# Patient Record
Sex: Female | Born: 1935 | Race: White | Hispanic: No | Smoking: Never smoker
Health system: Southern US, Community
[De-identification: ages and names within clinical notes are randomized; demographics above are authoritative.]

## PROBLEM LIST (undated history)

## (undated) DIAGNOSIS — R569 Unspecified convulsions: Secondary | ICD-10-CM

## (undated) DIAGNOSIS — I639 Cerebral infarction, unspecified: Secondary | ICD-10-CM

## (undated) DIAGNOSIS — I10 Essential (primary) hypertension: Secondary | ICD-10-CM

## (undated) DIAGNOSIS — F0390 Unspecified dementia without behavioral disturbance: Secondary | ICD-10-CM

## (undated) DIAGNOSIS — F419 Anxiety disorder, unspecified: Secondary | ICD-10-CM

## (undated) DIAGNOSIS — K219 Gastro-esophageal reflux disease without esophagitis: Secondary | ICD-10-CM

## (undated) HISTORY — PX: APPENDECTOMY: SHX54

## (undated) HISTORY — PX: FRACTURE SURGERY: SHX138

## (undated) HISTORY — PX: BREAST CYST EXCISION: SHX579

## (undated) HISTORY — PX: TONSILLECTOMY: SUR1361

---

## 2016-11-10 DIAGNOSIS — I639 Cerebral infarction, unspecified: Secondary | ICD-10-CM

## 2016-11-10 HISTORY — DX: Cerebral infarction, unspecified: I63.9

## 2017-05-21 ENCOUNTER — Encounter (HOSPITAL_COMMUNITY): Payer: Self-pay | Admitting: Nurse Practitioner

## 2017-05-21 ENCOUNTER — Emergency Department (HOSPITAL_COMMUNITY): Payer: Medicare HMO

## 2017-05-21 ENCOUNTER — Inpatient Hospital Stay (HOSPITAL_COMMUNITY)
Admission: EM | Admit: 2017-05-21 | Discharge: 2017-05-25 | DRG: 481 | Disposition: A | Discharge status: Home / Self Care | Payer: Medicare HMO | Attending: Family Medicine | Admitting: Family Medicine

## 2017-05-21 DIAGNOSIS — Z79899 Other long term (current) drug therapy: Secondary | ICD-10-CM | POA: Diagnosis not present

## 2017-05-21 DIAGNOSIS — I10 Essential (primary) hypertension: Secondary | ICD-10-CM | POA: Diagnosis present

## 2017-05-21 DIAGNOSIS — D649 Anemia, unspecified: Secondary | ICD-10-CM | POA: Diagnosis present

## 2017-05-21 DIAGNOSIS — D62 Acute posthemorrhagic anemia: Secondary | ICD-10-CM | POA: Diagnosis not present

## 2017-05-21 DIAGNOSIS — S72141A Displaced intertrochanteric fracture of right femur, initial encounter for closed fracture: Principal | ICD-10-CM | POA: Diagnosis present

## 2017-05-21 DIAGNOSIS — Z8673 Personal history of transient ischemic attack (TIA), and cerebral infarction without residual deficits: Secondary | ICD-10-CM | POA: Diagnosis not present

## 2017-05-21 DIAGNOSIS — F0280 Dementia in other diseases classified elsewhere without behavioral disturbance: Secondary | ICD-10-CM | POA: Diagnosis present

## 2017-05-21 DIAGNOSIS — G301 Alzheimer's disease with late onset: Secondary | ICD-10-CM

## 2017-05-21 DIAGNOSIS — R569 Unspecified convulsions: Secondary | ICD-10-CM | POA: Diagnosis present

## 2017-05-21 DIAGNOSIS — W010XXA Fall on same level from slipping, tripping and stumbling without subsequent striking against object, initial encounter: Secondary | ICD-10-CM | POA: Diagnosis present

## 2017-05-21 DIAGNOSIS — E876 Hypokalemia: Secondary | ICD-10-CM | POA: Diagnosis present

## 2017-05-21 DIAGNOSIS — S72001A Fracture of unspecified part of neck of right femur, initial encounter for closed fracture: Secondary | ICD-10-CM

## 2017-05-21 DIAGNOSIS — G309 Alzheimer's disease, unspecified: Secondary | ICD-10-CM | POA: Diagnosis present

## 2017-05-21 DIAGNOSIS — Z419 Encounter for procedure for purposes other than remedying health state, unspecified: Secondary | ICD-10-CM

## 2017-05-21 DIAGNOSIS — D696 Thrombocytopenia, unspecified: Secondary | ICD-10-CM | POA: Diagnosis present

## 2017-05-21 HISTORY — DX: Essential (primary) hypertension: I10

## 2017-05-21 HISTORY — DX: Unspecified dementia, unspecified severity, without behavioral disturbance, psychotic disturbance, mood disturbance, and anxiety: F03.90

## 2017-05-21 HISTORY — DX: Cerebral infarction, unspecified: I63.9

## 2017-05-21 HISTORY — DX: Anxiety disorder, unspecified: F41.9

## 2017-05-21 HISTORY — DX: Unspecified convulsions: R56.9

## 2017-05-21 HISTORY — DX: Gastro-esophageal reflux disease without esophagitis: K21.9

## 2017-05-21 LAB — CBC WITH DIFFERENTIAL/PLATELET
Basophils Absolute: 0 10*3/uL (ref 0.0–0.1)
Basophils Relative: 0 %
EOS PCT: 1 %
Eosinophils Absolute: 0.1 10*3/uL (ref 0.0–0.7)
HCT: 32.8 % — ABNORMAL LOW (ref 36.0–46.0)
Hemoglobin: 11.3 g/dL — ABNORMAL LOW (ref 12.0–15.0)
LYMPHS ABS: 1.1 10*3/uL (ref 0.7–4.0)
LYMPHS PCT: 13 %
MCH: 31.3 pg (ref 26.0–34.0)
MCHC: 34.5 g/dL (ref 30.0–36.0)
MCV: 90.9 fL (ref 78.0–100.0)
MONO ABS: 0.6 10*3/uL (ref 0.1–1.0)
MONOS PCT: 7 %
Neutro Abs: 6.6 10*3/uL (ref 1.7–7.7)
Neutrophils Relative %: 79 %
PLATELETS: 161 10*3/uL (ref 150–400)
RBC: 3.61 MIL/uL — AB (ref 3.87–5.11)
RDW: 14.4 % (ref 11.5–15.5)
WBC: 8.4 10*3/uL (ref 4.0–10.5)

## 2017-05-21 LAB — BASIC METABOLIC PANEL
Anion gap: 10 (ref 5–15)
BUN: 20 mg/dL (ref 6–20)
CHLORIDE: 102 mmol/L (ref 101–111)
CO2: 25 mmol/L (ref 22–32)
Calcium: 9.1 mg/dL (ref 8.9–10.3)
Creatinine, Ser: 0.85 mg/dL (ref 0.44–1.00)
GFR calc Af Amer: >60 mL/min (ref >60)
GFR calc non Af Amer: >60 mL/min (ref >60)
GLUCOSE: 122 mg/dL — AB (ref 65–99)
POTASSIUM: 3.2 mmol/L — AB (ref 3.5–5.1)
Sodium: 137 mmol/L (ref 135–145)

## 2017-05-21 MED ORDER — FENTANYL CITRATE (PF) 100 MCG/2ML IJ SOLN: 12.5000 ug | Freq: Once | INTRAMUSCULAR | Status: DC

## 2017-05-21 MED ORDER — FENTANYL CITRATE (PF) 100 MCG/2ML IJ SOLN
12.5000 ug | Freq: Once | INTRAMUSCULAR | Status: AC | PRN
  Administered 2017-05-22: 12.5 ug via INTRAMUSCULAR
  Filled 2017-05-21: qty 2

## 2017-05-21 MED ORDER — LABETALOL HCL 5 MG/ML IV SOLN
20.0000 mg | Freq: Once | INTRAVENOUS | Status: AC
  Administered 2017-05-21: 20 mg via INTRAVENOUS
  Filled 2017-05-21: qty 4

## 2017-05-21 NOTE — ED Provider Notes (Signed)
[facility]-EMERGENCY DEPT Provider Note   CSN: [id number] Arrival date & time: 05/21/17  1951     History   Chief Complaint Chief Complaint  Patient presents with  . Fall    HPI Lynn Reynolds is a 83 y.o. female with past medical history of dementia, hypertension,CVA, presenting with acute onset of right hip pain status post mechanical fall. And states she was playing a game when she jumped up and tripped, falling backwards hitting her head on the couch. Denies LOC. States pain in her head lasted a short period of time, however her right hip continues to give her pain well as her right knee. States hip pain is made worse with movement. She denies numbness or tingling in her extremities, headache, vision changes, nausea, neck or back pain, wounds, or any other symptoms. Her family states she is at her mental baseline. She is not on anticoagulation.   The history is provided by the patient and a relative.    Past Medical History:  Diagnosis Date  . CVA (cerebral vascular accident) (HCC)   . Dementia     Patient Active Problem List   Diagnosis Date Noted  . Closed intertrochanteric fracture of hip, right, initial encounter (HCC) 05/21/2017    Past Surgical History:  Procedure Laterality Date  . APPENDECTOMY      OB History    No data available       Home Medications    Prior to Admission medications   Medication Sig Start Date End Date Taking? Authorizing Provider  acetaminophen (TYLENOL) 500 MG tablet Take 500 mg by mouth every 4 (four) hours as needed for mild pain or moderate pain.   Yes [provider]  alum & mag hydroxide-simeth (MAALOX PLUS) 400-400-40 MG/5ML suspension Take 15 mLs by mouth every 6 (six) hours as needed for indigestion.   Yes [provider]  busPIRone (BUSPAR) 15 MG tablet Take 7.5 mg by mouth 2 (two) times daily.   Yes [provider]  carvedilol (COREG) 12.5 MG tablet Take 12.5 mg by mouth 2 (two) times daily with a  meal.   Yes [provider]  cholecalciferol (VITAMIN D) 1000 units tablet Take 2,000 Units by mouth daily.   Yes [provider]  estradiol (ESTRACE) 0.1 MG/GM vaginal cream Place 1 Applicatorful vaginally 2 (two) times a week.   Yes [provider]  ferrous sulfate 325 (65 FE) MG EC tablet Take 325 mg by mouth 2 (two) times daily.   Yes [provider]  hydrochlorothiazide (MICROZIDE) 12.5 MG capsule Take 12.5 mg by mouth daily.   Yes [provider]  levETIRAcetam (KEPPRA) 500 MG tablet Take 500 mg by mouth 2 (two) times daily.   Yes [provider]  Melatonin 3 MG TABS Take 3 mg by mouth at bedtime.   Yes [provider]  OVER THE COUNTER MEDICATION Place 1 drop into both ears daily.   Yes [provider]  phenazopyridine (PYRIDIUM) 100 MG tablet Take 200 mg by mouth 3 (three) times daily as needed for pain.   Yes [provider]  senna-docusate (SENOKOT-S) 8.6-50 MG tablet Take 1 tablet by mouth daily.   Yes [provider]  sertraline (ZOLOFT) 50 MG tablet Take 50 mg by mouth daily.   Yes [provider]  vitamin B-12 (CYANOCOBALAMIN) 500 MCG tablet Take 1,500 mcg by mouth daily.   Yes [provider]    Family History History reviewed. No pertinent family history.  Social  History Social History  Substance Use Topics  . Smoking status: Never Smoker  . Smokeless tobacco: Never Used  . Alcohol use Not on file     Allergies   Latex   Review of Systems Review of Systems  Constitutional: Negative.   HENT: Negative.   Eyes: Negative for visual disturbance.  Respiratory: Negative for shortness of breath.   Cardiovascular: Negative for chest pain.  Gastrointestinal: Negative for abdominal pain, nausea and vomiting.  Genitourinary: Negative.   Musculoskeletal: Positive for arthralgias and myalgias. Negative for back pain and neck pain.  Skin: Negative for wound.    Neurological: Negative for syncope, facial asymmetry, speech difficulty, numbness and headaches.  Hematological: Does not bruise/bleed easily.  Psychiatric/Behavioral: Negative for confusion.     Physical Exam Updated Vital Signs BP (!) 197/75 (BP Location: Right Arm)   Pulse 70   Temp 97.6 F (36.4 C) (Oral)   Resp 18   SpO2 100%   Physical Exam  Constitutional: She appears well-developed and well-nourished. No distress.  HENT:  Head: Normocephalic and atraumatic.  Mouth/Throat: Oropharynx is clear and moist.  No scalp hematoma or tenderness.  Eyes: Pupils are equal, round, and reactive to light. Conjunctivae and EOM are normal.  Neck: Normal range of motion. Neck supple.  Cardiovascular: Normal rate, regular rhythm and intact distal pulses.   Pulmonary/Chest: Effort normal and breath sounds normal. No respiratory distress. She has no wheezes. She has no rales.  Abdominal: Soft. Bowel sounds are normal. She exhibits no distension. There is no tenderness. There is no rebound and no guarding.  Musculoskeletal:  Right hip with tenderness in the groin, and pain with internal/external rotation as well as flexion and extension. No gross deformities. Right knee with mild generalized tenderness,normal range of motion. No deformities, edema or ecchymosis. Left lower extremity as well as bilateral upper extremities with normal range of motion, nontender, without deformities or edema. No C, T, or L-spine or paraspinal tenderness.  Neurological: She is alert.  No cranial nerve deficits. 5/5 strength bilateral upper extremities and left lower extremity. Difficult to assess strength in right leg secondary to pain. Normal finger to nose. Normal sensation all distal extremities. No facial asymmetry or slurred speech.  Skin: Skin is warm.  Psychiatric: She has a normal mood and affect. Her behavior is normal.  Nursing note and vitals reviewed.    ED Treatments / Results  Labs (all labs  ordered are listed, but only abnormal results are displayed) Labs Reviewed  BASIC METABOLIC PANEL - Abnormal; Notable for the following:       Result Value   Potassium 3.2 (*)    Glucose, Bld 122 (*)    All other components within normal limits  CBC WITH DIFFERENTIAL/PLATELET - Abnormal; Notable for the following:    RBC 3.61 (*)    Hemoglobin 11.3 (*)    HCT 32.8 (*)    All other components within normal limits    EKG  EKG Interpretation None       Radiology Dg Knee Complete 4 Views Right  Result Date: 05/21/2017 CLINICAL DATA:  Status post fall, with right knee pain. Initial encounter. EXAM: RIGHT KNEE - COMPLETE 4+ VIEW COMPARISON:  None. FINDINGS: There is no evidence of fracture or dislocation. The joint spaces are preserved. No significant degenerative change is seen; the patellofemoral joint is grossly unremarkable in appearance. Chondrocalcinosis is noted. No significant joint effusion is seen. The visualized soft tissues are normal in appearance. IMPRESSION: 1. No evidence of  fracture or dislocation. 2. Chondrocalcinosis noted. Electronically Signed   By: Roanna Raider M.D.   On: 05/21/2017 22:26   Dg Hip Unilat With Pelvis 2-3 Views Right  Result Date: 05/21/2017 CLINICAL DATA:  Status post fall, with right hip pain. Initial encounter. EXAM: DG HIP (WITH OR WITHOUT PELVIS) 2-3V RIGHT COMPARISON:  None. FINDINGS: There appears to be a minimally displaced right femoral intertrochanteric fracture. Both femoral heads are seated normally within their respective acetabula. The proximal right femur appears intact. Mild degenerative change is noted at the lower lumbar spine. The sacroiliac joints are unremarkable in appearance. The visualized bowel gas pattern is grossly unremarkable in appearance. IMPRESSION: Minimally displaced right femoral intertrochanteric fracture suspected. Electronically Signed   By: Roanna Raider M.D.   On: 05/21/2017 22:26    Procedures Procedures  (including critical care time)  Medications Ordered in ED Medications  fentaNYL (SUBLIMAZE) injection 12.5 mcg (not administered)  labetalol (NORMODYNE,TRANDATE) injection 20 mg (not administered)     Initial Impression / Assessment and Plan / ED Course  I have reviewed the triage vital signs and the nursing notes.  Pertinent labs & imaging results that were available during my care of the patient were reviewed by me and considered in my medical decision making (see chart for details).     Patient presenting with right hip pain status post mechanical fall. X-ray of right hip showing minimally displaced intertrochanteric fracture. NV intact.  No spinal or paraspinal tenderness.  No focal neuro deficits, no scalp hematoma or tenderness. Patient is at her mental baseline per family members.  Remainder of exam unremarkable. Discussed results with family and patient. Consult made to orthopedics, spoke with Dr. Dion Saucier, who recommends patient be transferred to [facility] for medical admission. Patient to be NPO at midnight, in preparation for surgery tomorrow. Patient agreeable to plan for transfer, with anticipated discussion with orthopedics in the morning. Consult made to hospitalist for medical admission and transfer; spoke with Dr. Maryfrances Bunnell with [facility], who accepts pt to service for transfer to [facility]. Accepting physician at [facility] is Dr. Julian Reil.  Patient discussed with and seen by Dr. Adriana Simas.  The patient appears reasonably stabilized for admission considering the current resources, flow, and capabilities available in the ED at this time, and I doubt any other [facility] requiring further screening and/or treatment in the ED prior to admission.  Final Clinical Impressions(s) / ED Diagnoses   Final diagnoses:  Displaced intertrochanteric fracture of right femur, initial encounter for closed fracture [facility])    New Prescriptions New Prescriptions   No medications on file       Russo, [place] N, PA-C 05/21/17 2330    Donnetta Hutching, MD 05/23/17 1546

## 2017-05-21 NOTE — ED Triage Notes (Signed)
Pt is presented by family members who report that she had a fall, c/o severe bilateral ankle and knee pain.

## 2017-05-22 ENCOUNTER — Inpatient Hospital Stay (HOSPITAL_COMMUNITY): Payer: Medicare HMO

## 2017-05-22 ENCOUNTER — Encounter (HOSPITAL_COMMUNITY): Payer: Self-pay | Admitting: Family Medicine

## 2017-05-22 ENCOUNTER — Inpatient Hospital Stay (HOSPITAL_COMMUNITY): Payer: Medicare HMO | Admitting: Certified Registered Nurse Anesthetist

## 2017-05-22 ENCOUNTER — Encounter (HOSPITAL_COMMUNITY)
Admission: EM | Disposition: A | Discharge status: Home / Self Care | Payer: Self-pay | Source: Home / Self Care | Attending: Family Medicine

## 2017-05-22 DIAGNOSIS — D649 Anemia, unspecified: Secondary | ICD-10-CM | POA: Diagnosis present

## 2017-05-22 DIAGNOSIS — F0280 Dementia in other diseases classified elsewhere without behavioral disturbance: Secondary | ICD-10-CM | POA: Diagnosis present

## 2017-05-22 DIAGNOSIS — G301 Alzheimer's disease with late onset: Secondary | ICD-10-CM

## 2017-05-22 DIAGNOSIS — I10 Essential (primary) hypertension: Secondary | ICD-10-CM | POA: Diagnosis present

## 2017-05-22 DIAGNOSIS — R569 Unspecified convulsions: Secondary | ICD-10-CM

## 2017-05-22 DIAGNOSIS — E876 Hypokalemia: Secondary | ICD-10-CM | POA: Diagnosis present

## 2017-05-22 HISTORY — PX: FEMUR IM NAIL: SHX1597

## 2017-05-22 LAB — CBC
HCT: 31.5 % — ABNORMAL LOW (ref 36.0–46.0)
Hemoglobin: 10.7 g/dL — ABNORMAL LOW (ref 12.0–15.0)
MCH: 30.6 pg (ref 26.0–34.0)
MCHC: 34 g/dL (ref 30.0–36.0)
MCV: 90 fL (ref 78.0–100.0)
Platelets: 139 10*3/uL — ABNORMAL LOW (ref 150–400)
RBC: 3.5 MIL/uL — ABNORMAL LOW (ref 3.87–5.11)
RDW: 14.4 % (ref 11.5–15.5)
WBC: 6 10*3/uL (ref 4.0–10.5)

## 2017-05-22 LAB — BASIC METABOLIC PANEL
ANION GAP: 7 (ref 5–15)
BUN: 17 mg/dL (ref 6–20)
CALCIUM: 8.7 mg/dL — AB (ref 8.9–10.3)
CO2: 26 mmol/L (ref 22–32)
CREATININE: 0.8 mg/dL (ref 0.44–1.00)
Chloride: 102 mmol/L (ref 101–111)
Glucose, Bld: 120 mg/dL — ABNORMAL HIGH (ref 65–99)
Potassium: 3.2 mmol/L — ABNORMAL LOW (ref 3.5–5.1)
SODIUM: 135 mmol/L (ref 135–145)

## 2017-05-22 LAB — MAGNESIUM: MAGNESIUM: 1.8 mg/dL (ref 1.7–2.4)

## 2017-05-22 LAB — TROPONIN I

## 2017-05-22 LAB — SURGICAL PCR SCREEN
MRSA, PCR: POSITIVE — AB
Staphylococcus aureus: POSITIVE — AB

## 2017-05-22 SURGERY — INSERTION, INTRAMEDULLARY ROD, FEMUR
Anesthesia: General | Site: Hip | Laterality: Right

## 2017-05-22 MED ORDER — MELATONIN 3 MG PO TABS
3.0000 mg | ORAL_TABLET | Freq: Every day | ORAL | Status: DC
Start: 2017-05-22 — End: 2017-05-22

## 2017-05-22 MED ORDER — CEFAZOLIN SODIUM-DEXTROSE 2-4 GM/100ML-% IV SOLN
2.0000 g | INTRAVENOUS | Status: AC
  Administered 2017-05-22: 2 g via INTRAVENOUS
  Filled 2017-05-22: qty 100

## 2017-05-22 MED ORDER — BISACODYL 10 MG RE SUPP: 10.0000 mg | Freq: Every day | RECTAL | Status: DC | PRN

## 2017-05-22 MED ORDER — FENTANYL CITRATE (PF) 100 MCG/2ML IJ SOLN
INTRAMUSCULAR | Status: DC | PRN
  Administered 2017-05-22: 25 ug via INTRAVENOUS
  Administered 2017-05-22: 50 ug via INTRAVENOUS

## 2017-05-22 MED ORDER — MORPHINE SULFATE (PF) 4 MG/ML IV SOLN: 0.5000 mg | INTRAVENOUS | Status: DC | PRN

## 2017-05-22 MED ORDER — SUGAMMADEX SODIUM 200 MG/2ML IV SOLN
INTRAVENOUS | Status: DC | PRN
  Administered 2017-05-22: 100 mg via INTRAVENOUS

## 2017-05-22 MED ORDER — BUSPIRONE HCL 5 MG PO TABS
7.5000 mg | ORAL_TABLET | Freq: Two times a day (BID) | ORAL | Status: DC
  Filled 2017-05-22 (×2): qty 1.5

## 2017-05-22 MED ORDER — ACETAMINOPHEN 325 MG PO TABS
650.0000 mg | ORAL_TABLET | Freq: Four times a day (QID) | ORAL | Status: DC | PRN
  Administered 2017-05-22 – 2017-05-24 (×5): 650 mg via ORAL
  Filled 2017-05-22 (×5): qty 2

## 2017-05-22 MED ORDER — ALBUMIN HUMAN 5 % IV SOLN
INTRAVENOUS | Status: DC | PRN
  Administered 2017-05-22: 14:00:00 via INTRAVENOUS

## 2017-05-22 MED ORDER — LIDOCAINE HCL (CARDIAC) 20 MG/ML IV SOLN
INTRAVENOUS | Status: DC | PRN
  Administered 2017-05-22: 60 mg via INTRAVENOUS

## 2017-05-22 MED ORDER — ONDANSETRON HCL 4 MG/2ML IJ SOLN: 4.0000 mg | Freq: Four times a day (QID) | INTRAMUSCULAR | Status: DC | PRN

## 2017-05-22 MED ORDER — MORPHINE SULFATE (PF) 2 MG/ML IV SOLN
0.5000 mg | INTRAVENOUS | Status: DC | PRN
  Administered 2017-05-22: 0.5 mg via INTRAVENOUS
  Filled 2017-05-22: qty 1

## 2017-05-22 MED ORDER — PHENOL 1.4 % MT LIQD: 1.0000 | OROMUCOSAL | Status: DC | PRN

## 2017-05-22 MED ORDER — HYDROCODONE-ACETAMINOPHEN 5-325 MG PO TABS: 1.0000 | ORAL_TABLET | Freq: Four times a day (QID) | ORAL | Status: DC | PRN

## 2017-05-22 MED ORDER — ACETAMINOPHEN 650 MG RE SUPP: 650.0000 mg | Freq: Four times a day (QID) | RECTAL | Status: DC | PRN

## 2017-05-22 MED ORDER — ROCURONIUM BROMIDE 100 MG/10ML IV SOLN
INTRAVENOUS | Status: DC | PRN
  Administered 2017-05-22: 50 mg via INTRAVENOUS

## 2017-05-22 MED ORDER — ONDANSETRON HCL 4 MG/2ML IJ SOLN: 4.0000 mg | Freq: Once | INTRAMUSCULAR | Status: DC | PRN

## 2017-05-22 MED ORDER — HYDROCODONE-ACETAMINOPHEN 5-325 MG PO TABS: 1.0000 | ORAL_TABLET | Freq: Four times a day (QID) | ORAL | 0 refills | Status: DC | PRN

## 2017-05-22 MED ORDER — POTASSIUM CHLORIDE CRYS ER 20 MEQ PO TBCR
40.0000 meq | EXTENDED_RELEASE_TABLET | Freq: Two times a day (BID) | ORAL | Status: AC
  Administered 2017-05-22 (×2): 40 meq via ORAL
  Filled 2017-05-22 (×3): qty 2

## 2017-05-22 MED ORDER — CHLORHEXIDINE GLUCONATE 4 % EX LIQD: 60.0000 mL | Freq: Once | CUTANEOUS | Status: DC

## 2017-05-22 MED ORDER — ASPIRIN EC 325 MG PO TBEC
325.0000 mg | DELAYED_RELEASE_TABLET | Freq: Two times a day (BID) | ORAL | Status: DC
  Administered 2017-05-23 – 2017-05-25 (×5): 325 mg via ORAL
  Filled 2017-05-22 (×6): qty 1

## 2017-05-22 MED ORDER — POLYETHYLENE GLYCOL 3350 17 G PO PACK: 17.0000 g | PACK | Freq: Every day | ORAL | Status: DC | PRN

## 2017-05-22 MED ORDER — METOCLOPRAMIDE HCL 5 MG PO TABS: 5.0000 mg | ORAL_TABLET | Freq: Three times a day (TID) | ORAL | Status: DC | PRN

## 2017-05-22 MED ORDER — PROPOFOL 10 MG/ML IV BOLUS
INTRAVENOUS | Status: DC | PRN
Start: 2017-05-22 — End: 2017-05-22
  Administered 2017-05-22: 150 mg via INTRAVENOUS

## 2017-05-22 MED ORDER — SERTRALINE HCL 50 MG PO TABS
50.0000 mg | ORAL_TABLET | Freq: Every day | ORAL | Status: DC
  Administered 2017-05-22 – 2017-05-25 (×4): 50 mg via ORAL
  Filled 2017-05-22 (×4): qty 1

## 2017-05-22 MED ORDER — KETAMINE HCL-SODIUM CHLORIDE 100-0.9 MG/10ML-% IV SOSY
PREFILLED_SYRINGE | INTRAVENOUS | Status: AC
  Filled 2017-05-22: qty 10

## 2017-05-22 MED ORDER — FENTANYL CITRATE (PF) 100 MCG/2ML IJ SOLN: 25.0000 ug | INTRAMUSCULAR | Status: DC | PRN

## 2017-05-22 MED ORDER — CARVEDILOL 12.5 MG PO TABS
12.5000 mg | ORAL_TABLET | Freq: Two times a day (BID) | ORAL | Status: DC
  Administered 2017-05-22: 12.5 mg via ORAL
  Filled 2017-05-22 (×2): qty 1

## 2017-05-22 MED ORDER — MIDAZOLAM HCL 2 MG/2ML IJ SOLN
INTRAMUSCULAR | Status: AC
  Filled 2017-05-22: qty 2

## 2017-05-22 MED ORDER — SENNOSIDES-DOCUSATE SODIUM 8.6-50 MG PO TABS
1.0000 | ORAL_TABLET | Freq: Every day | ORAL | Status: DC
  Administered 2017-05-23 – 2017-05-25 (×3): 1 via ORAL
  Filled 2017-05-22 (×3): qty 1

## 2017-05-22 MED ORDER — FENTANYL CITRATE (PF) 250 MCG/5ML IJ SOLN
INTRAMUSCULAR | Status: AC
  Filled 2017-05-22: qty 5

## 2017-05-22 MED ORDER — ASPIRIN EC 325 MG PO TBEC: 325.0000 mg | DELAYED_RELEASE_TABLET | Freq: Two times a day (BID) | ORAL | 0 refills | Status: DC

## 2017-05-22 MED ORDER — 0.9 % SODIUM CHLORIDE (POUR BTL) OPTIME
TOPICAL | Status: DC | PRN
  Administered 2017-05-22: 1000 mL

## 2017-05-22 MED ORDER — MEPERIDINE HCL 25 MG/ML IJ SOLN: 6.2500 mg | INTRAMUSCULAR | Status: DC | PRN

## 2017-05-22 MED ORDER — LEVETIRACETAM 500 MG PO TABS
500.0000 mg | ORAL_TABLET | Freq: Two times a day (BID) | ORAL | Status: DC
  Administered 2017-05-22 – 2017-05-25 (×7): 500 mg via ORAL
  Filled 2017-05-22 (×8): qty 1

## 2017-05-22 MED ORDER — PHENYLEPHRINE 40 MCG/ML (10ML) SYRINGE FOR IV PUSH (FOR BLOOD PRESSURE SUPPORT)
PREFILLED_SYRINGE | INTRAVENOUS | Status: AC
  Filled 2017-05-22: qty 10

## 2017-05-22 MED ORDER — TIZANIDINE HCL 2 MG PO TABS: 2.0000 mg | ORAL_TABLET | Freq: Four times a day (QID) | ORAL | 0 refills | Status: DC | PRN

## 2017-05-22 MED ORDER — PROPOFOL 10 MG/ML IV BOLUS
INTRAVENOUS | Status: AC
  Filled 2017-05-22: qty 20

## 2017-05-22 MED ORDER — ONDANSETRON HCL 4 MG PO TABS: 4.0000 mg | ORAL_TABLET | Freq: Four times a day (QID) | ORAL | Status: DC | PRN

## 2017-05-22 MED ORDER — FERROUS SULFATE 325 (65 FE) MG PO TABS
325.0000 mg | ORAL_TABLET | Freq: Two times a day (BID) | ORAL | Status: DC
  Administered 2017-05-22 – 2017-05-25 (×6): 325 mg via ORAL
  Filled 2017-05-22 (×8): qty 1

## 2017-05-22 MED ORDER — HYDROCHLOROTHIAZIDE 12.5 MG PO CAPS
12.5000 mg | ORAL_CAPSULE | Freq: Every day | ORAL | Status: DC
  Administered 2017-05-23 – 2017-05-25 (×3): 12.5 mg via ORAL
  Filled 2017-05-22 (×3): qty 1

## 2017-05-22 MED ORDER — ONDANSETRON HCL 4 MG/2ML IJ SOLN
INTRAMUSCULAR | Status: DC | PRN
  Administered 2017-05-22: 4 mg via INTRAVENOUS

## 2017-05-22 MED ORDER — POVIDONE-IODINE 10 % EX SWAB: 2.0000 "application " | Freq: Once | CUTANEOUS | Status: DC

## 2017-05-22 MED ORDER — MENTHOL 3 MG MT LOZG: 1.0000 | LOZENGE | OROMUCOSAL | Status: DC | PRN

## 2017-05-22 MED ORDER — KCL IN DEXTROSE-NACL 20-5-0.45 MEQ/L-%-% IV SOLN
INTRAVENOUS | Status: DC
  Administered 2017-05-22 – 2017-05-23 (×2): via INTRAVENOUS
  Filled 2017-05-22 (×2): qty 1000

## 2017-05-22 MED ORDER — DOCUSATE SODIUM 100 MG PO CAPS
100.0000 mg | ORAL_CAPSULE | Freq: Two times a day (BID) | ORAL | Status: DC
  Administered 2017-05-22 – 2017-05-25 (×5): 100 mg via ORAL
  Filled 2017-05-22 (×5): qty 1

## 2017-05-22 MED ORDER — LACTATED RINGERS IV SOLN
INTRAVENOUS | Status: DC
  Administered 2017-05-22 (×2): via INTRAVENOUS

## 2017-05-22 MED ORDER — METOCLOPRAMIDE HCL 5 MG/ML IJ SOLN: 5.0000 mg | Freq: Three times a day (TID) | INTRAMUSCULAR | Status: DC | PRN

## 2017-05-22 MED ORDER — PHENYLEPHRINE 40 MCG/ML (10ML) SYRINGE FOR IV PUSH (FOR BLOOD PRESSURE SUPPORT)
PREFILLED_SYRINGE | INTRAVENOUS | Status: DC | PRN
  Administered 2017-05-22: 80 ug via INTRAVENOUS

## 2017-05-22 SURGICAL SUPPLY — 52 items
BENZOIN TINCTURE PRP APPL 2/3 (GAUZE/BANDAGES/DRESSINGS) IMPLANT
BOOTCOVER CLEANROOM LRG (PROTECTIVE WEAR) IMPLANT
CLOSURE STERI-STRIP 1/2X4 (GAUZE/BANDAGES/DRESSINGS)
CLSR STERI-STRIP ANTIMIC 1/2X4 (GAUZE/BANDAGES/DRESSINGS) IMPLANT
COVER PERINEAL POST (MISCELLANEOUS) IMPLANT
COVER SURGICAL LIGHT HANDLE (MISCELLANEOUS) ×3 IMPLANT
DRAPE C-ARM 42X72 X-RAY (DRAPES) ×3 IMPLANT
DRAPE C-ARMOR (DRAPES) IMPLANT
DRAPE STERI IOBAN 125X83 (DRAPES) IMPLANT
DRSG MEPILEX BORDER 4X4 (GAUZE/BANDAGES/DRESSINGS) ×6 IMPLANT
DRSG MEPILEX BORDER 4X8 (GAUZE/BANDAGES/DRESSINGS) IMPLANT
DURAPREP 26ML APPLICATOR (WOUND CARE) ×3 IMPLANT
ELECT CAUTERY BLADE 6.4 (BLADE) IMPLANT
ELECT REM PT RETURN 9FT ADLT (ELECTROSURGICAL) ×3
ELECTRODE REM PT RTRN 9FT ADLT (ELECTROSURGICAL) ×1 IMPLANT
EVACUATOR 1/8 PVC DRAIN (DRAIN) IMPLANT
FACESHIELD WRAPAROUND (MASK) IMPLANT
GAUZE XEROFORM 5X9 LF (GAUZE/BANDAGES/DRESSINGS) ×3 IMPLANT
GLOVE BIOGEL PI ORTHO PRO SZ8 (GLOVE) ×4
GLOVE ORTHO TXT STRL SZ7.5 (GLOVE) ×6 IMPLANT
GLOVE PI ORTHO PRO STRL SZ8 (GLOVE) ×2 IMPLANT
GLOVE SURG ORTHO 8.0 STRL STRW (GLOVE) ×6 IMPLANT
GOWN STRL REUS W/ TWL XL LVL3 (GOWN DISPOSABLE) ×1 IMPLANT
GOWN STRL REUS W/TWL 2XL LVL3 (GOWN DISPOSABLE) IMPLANT
GOWN STRL REUS W/TWL XL LVL3 (GOWN DISPOSABLE) ×2
GUIDEPIN 3.2X17.5 THRD DISP (PIN) ×6 IMPLANT
GUIDEWIRE BALL NOSE 100CM (WIRE) ×3 IMPLANT
HFN RH 130 DEG 11MM X 360MM (Orthopedic Implant) ×3 IMPLANT
HIP FRA NAIL LAG SCREW 10.5X90 (Orthopedic Implant) ×3 IMPLANT
KIT ROOM TURNOVER OR (KITS) ×3 IMPLANT
LINER BOOT UNIVERSAL DISP (MISCELLANEOUS) IMPLANT
MANIFOLD NEPTUNE II (INSTRUMENTS) IMPLANT
NS IRRIG 1000ML POUR BTL (IV SOLUTION) ×3 IMPLANT
PACK GENERAL/GYN (CUSTOM PROCEDURE TRAY) IMPLANT
PACK TOTAL JOINT (CUSTOM PROCEDURE TRAY) ×3 IMPLANT
PAD ARMBOARD 7.5X6 YLW CONV (MISCELLANEOUS) ×6 IMPLANT
SCREW LAG HIP FRA NAIL 10.5X90 (Orthopedic Implant) ×1 IMPLANT
STAPLER VISISTAT 35W (STAPLE) IMPLANT
SUT VIC AB 0 CT1 27 (SUTURE) ×2
SUT VIC AB 0 CT1 27XBRD ANBCTR (SUTURE) ×1 IMPLANT
SUT VIC AB 0 CTB1 27 (SUTURE) IMPLANT
SUT VIC AB 1 CTX 36 (SUTURE) ×2
SUT VIC AB 1 CTX36XBRD ANBCTR (SUTURE) ×1 IMPLANT
SUT VIC AB 2-0 CT1 27 (SUTURE) ×2
SUT VIC AB 2-0 CT1 TAPERPNT 27 (SUTURE) ×1 IMPLANT
SUT VIC AB 2-0 FS1 27 (SUTURE) IMPLANT
SUT VIC AB 2-0 SH 27 (SUTURE)
SUT VIC AB 2-0 SH 27XBRD (SUTURE) IMPLANT
SUT VIC AB 3-0 SH 8-18 (SUTURE) IMPLANT
TOWEL OR 17X24 6PK STRL BLUE (TOWEL DISPOSABLE) IMPLANT
TOWEL OR 17X26 10 PK STRL BLUE (TOWEL DISPOSABLE) IMPLANT
WATER STERILE IRR 1000ML POUR (IV SOLUTION) IMPLANT

## 2017-05-22 NOTE — Progress Notes (Addendum)
Patient's family updated.  Plan for [facility] later today at [facility].    The risks benefits and alternatives were discussed with the patient including but not limited to the risks of nonoperative treatment, versus surgical intervention including infection, bleeding, nerve injury, malunion, nonunion, the need for revision surgery, hardware prominence, hardware failure, the need for hardware removal, blood clots, cardiopulmonary complications, morbidity, mortality, among others, and they were willing to proceed.    She has had a stroke earlier this year, we will have to determine the best form of DVT prophylaxis.  Will appreciate [facility] guidance/input on this.   Eulas PostLANDAU,Alisha Burgo P, MD

## 2017-05-22 NOTE — ED Notes (Signed)
Daughter Pam Powell at [phone] for question or updates.

## 2017-05-22 NOTE — Anesthesia Procedure Notes (Signed)
Procedure Name: Intubation Date/Time: 05/22/2017 1:23 PM Performed by: Shirlyn Goltz Pre-anesthesia Checklist: Patient identified, Emergency Drugs available, Patient being monitored and Suction available Patient Re-evaluated:Patient Re-evaluated prior to induction Oxygen Delivery Method: Circle system utilized Preoxygenation: Pre-oxygenation with 100% oxygen Induction Type: IV induction Ventilation: Mask ventilation without difficulty Laryngoscope Size: Mac and 3 Grade View: Grade III Tube type: Oral Tube size: 7.0 mm Number of attempts: 1 Airway Equipment and Method: Stylet Placement Confirmation: ETT inserted through vocal cords under direct vision,  positive ETCO2 and breath sounds checked- equal and bilateral Secured at: 20 cm Tube secured with: Tape Dental Injury: Teeth and Oropharynx as per pre-operative assessment

## 2017-05-22 NOTE — Progress Notes (Signed)
PHARMACIST - PHYSICIAN ORDER COMMUNICATION  CONCERNING: P&T Medication Policy on Herbal Medications  DESCRIPTION:  This patient's order for:  melatonin  has been noted.  This product(s) is classified as an "herbal" or natural product. Due to a lack of definitive safety studies or FDA approval, nonstandard manufacturing practices, plus the potential risk of unknown drug-drug interactions while on inpatient medications, the Pharmacy and Therapeutics Committee does not permit the use of "herbal" or natural products of this type within [facility].   ACTION TAKEN: The pharmacy department is unable to verify this order at this time and your patient has been informed of this safety policy. Please reevaluate patient's clinical condition at discharge and address if the herbal or natural product(s) should be resumed at that time.   Thanks Lorenza EvangelistGreen, Maryjane Benedict R 05/22/2017 3:10 AM

## 2017-05-22 NOTE — ED Notes (Signed)
Pt could not get out of bed to much pain.

## 2017-05-22 NOTE — Anesthesia Preprocedure Evaluation (Signed)
Anesthesia Evaluation  Patient identified by MRN, date of birth, ID band Patient awake    Reviewed: Allergy & Precautions, NPO status , Patient's Chart, lab work & pertinent test results  Airway Mallampati: II  TM Distance: >3 FB Neck ROM: Full    Dental no notable dental hx.    Pulmonary neg pulmonary ROS,    Pulmonary exam normal breath sounds clear to auscultation       Cardiovascular hypertension, Pt. on medications Normal cardiovascular exam Rhythm:Regular Rate:Normal     Neuro/Psych Seizures -,  CVA negative psych ROS   GI/Hepatic negative GI ROS, Neg liver ROS,   Endo/Other  negative endocrine ROS  Renal/GU negative Renal ROS  negative genitourinary   Musculoskeletal negative musculoskeletal ROS (+)   Abdominal   Peds negative pediatric ROS (+)  Hematology  (+) anemia ,   Anesthesia Other Findings   Reproductive/Obstetrics negative OB ROS                             Anesthesia Physical Anesthesia Plan  ASA: III  Anesthesia Plan: Spinal   Post-op Pain Management:    Induction:   PONV Risk Score and Plan: 2 and Ondansetron, Dexamethasone and Treatment may vary due to age or medical condition  Airway Management Planned: Nasal Cannula and Natural Airway  Additional Equipment:   Intra-op Plan:   Post-operative Plan:   Informed Consent: I have reviewed the patients History and Physical, chart, labs and discussed the procedure including the risks, benefits and alternatives for the proposed anesthesia with the patient or authorized representative who has indicated his/her understanding and acceptance.   Dental advisory given  Plan Discussed with:   Anesthesia Plan Comments: (  )        Anesthesia Quick Evaluation

## 2017-05-22 NOTE — H&P (Signed)
History and Physical  Patient Name: Lynn Reynolds     AVW:[id number]    DOB: 1931/02/10    DOA: 05/21/2017 Referring provider: Dorthula Nettles, MD PCP: Patient, No Pcp Per   Patient coming from: [facility]     Chief Complaint: Hip pain and fall  HPI: Lynn Reynolds is a 83 y.o. 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  The patient was in her normal state of health until this evening when she was playing a game in the common room at her facility with some other residents, stood up suddenly, lost her balance, fell and had immediate RIGHT hip and knee pain and could not walk.    ED course:  -Afebrile, heart rate 67, respirations and pulse ox normal, initial BP 206/87 -Na 137, K 3.2, Cr 0.85 (baseline unknown), WBC 8.4K, Hgb 11.3 (baseline unknown) -Plain radiograph of the RIGHT hip showed a minimally displaced intertroch fracture -The case was discussed with Dr. Dion Saucier who agreed to see the patient, and [facility] were asked to admit for medical management   There was no preceding dizziness, weakness, lightheadedness or vertigo, palpitations, or dyspnea, nor are there any of those symptoms now.  There was some transient central chest pain, lasting only a few minutes, that was non-exertional and resolved spontaneously before she was evaluated by ED personnel.  The patient denies active heart issues, angina, or history of MI. She does not typically exert to an equivalent of 4 METS.  She had a hemorrhagic stroke in Feb, but denies history of CAD, CHF, or DM treated with insulin. She has no history of COPD.  She has no history of prolonged steroid use and does not use insulin.  She does not use alcohol, and has no history of withdrawal symptoms or delirium in the context of previous surgeries.  Anesthesia Specific concerns: Presence of loose teeth: None Anesthesia problems  in past: None History of bleeding disorder: None  Review of Systems:  Review of Systems  Constitutional: Negative for chills, fever and malaise/fatigue.  Respiratory: Negative for cough and shortness of breath.   Cardiovascular: Negative for chest pain, palpitations, orthopnea and leg swelling.  All other systems reviewed and are negative.         Past Medical History:  Diagnosis Date  . CVA (cerebral vascular accident) (HCC)   . Dementia     Past Surgical History:  Procedure Laterality Date  . APPENDECTOMY      Social History: Patient lives in Memory care.  Patient walks without assistance or with a walker at baseline. Moved down here from [facility] about three weeks ago to be near her daughter.  Never smoked.  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  No alcohol use.   Allergies  Allergen Reactions  . Latex     Family history: family history includes Diabetes in her mother.  Prior to Admission medications   Medication Sig Start Date End Date Taking? Authorizing Provider  acetaminophen (TYLENOL) 500 MG tablet Take 500 mg by mouth every 4 (four) hours as needed for mild pain or moderate pain.   Yes [provider]  alum & mag hydroxide-simeth (MAALOX PLUS) 400-400-40 MG/5ML suspension Take 15 mLs by mouth every 6 (six) hours as needed for indigestion.   Yes [provider]  busPIRone (BUSPAR) 15 MG tablet Take 7.5 mg by mouth 2 (two)  times daily.   Yes [provider]  carvedilol (COREG) 12.5 MG tablet Take 12.5 mg by mouth 2 (two) times daily with a meal.   Yes [provider]  cholecalciferol (VITAMIN D) 1000 units tablet Take 2,000 Units by mouth daily.   Yes [provider]  estradiol (ESTRACE) 0.1 MG/GM vaginal cream Place 1 Applicatorful vaginally 2 (two) times a week.   Yes [provider]  ferrous sulfate 325 (65 FE) MG EC tablet Take 325 mg by mouth 2 (two) times daily.   Yes [provider]    hydrochlorothiazide (MICROZIDE) 12.5 MG capsule Take 12.5 mg by mouth daily.   Yes [provider]  levETIRAcetam (KEPPRA) 500 MG tablet Take 500 mg by mouth 2 (two) times daily.   Yes [provider]  Melatonin 3 MG TABS Take 3 mg by mouth at bedtime.   Yes [provider]  OVER THE COUNTER MEDICATION Place 1 drop into both ears daily.   Yes [provider]  phenazopyridine (PYRIDIUM) 100 MG tablet Take 200 mg by mouth 3 (three) times daily as needed for pain.   Yes [provider]  senna-docusate (SENOKOT-S) 8.6-50 MG tablet Take 1 tablet by mouth daily.   Yes [provider]  sertraline (ZOLOFT) 50 MG tablet Take 50 mg by mouth daily.   Yes [provider]  vitamin B-12 (CYANOCOBALAMIN) 500 MCG tablet Take 1,500 mcg by mouth daily.   Yes [provider]       Physical Exam: BP (!) 149/70   Pulse 61   Temp 97.6 F (36.4 C) (Oral)   Resp 16   SpO2 98%  General appearance: Well-developed, thin elderly adult female, alert and in no acute distress.   Eyes: Anicteric, conjunctiva pink, lids and lashes normal. PERRL.    ENT: No nasal deformity, discharge, epistaxis.  Hearing very poor. OP moist without lesions.  Dentition good.    Lymph: No cervical, supraclavicular or axillary lymphadenopathy. Skin: Warm and dry.  No jaundice.  No suspicious rashes or lesions. Cardiac: RRR, nl S1-S2, no murmurs appreciated.  Capillary refill is brisk.  JVP not visible.  No LE edema.  Radial and DP pulses 2+ and symmetric.  No carotid bruits. Respiratory: Normal respiratory rate and rhythm.  CTAB without rales or wheezes. GI: Abdomen soft without rigidity.  No TTP. No ascites, distension, no hepatosplenomegaly.  MSK: Neither leg foreshortened or externally rotated.  No effusions.  No clubbing or cyanosis. Neuro: Oriented to "[place]" and to her daughter/son-in-law, not to hospital, year or month.  Sensorium intact and responding to  questions, attention seems normal.  Speech is fluent.  Moves all extremities equally and with normal coordination except right leg.  CNs normal.    Psych:  Behavior appropriate.  Affect pleasant.   No evidence of aural or visual hallucinations or delusions.     Labs on Admission:  I have personally reviewed following labs and imaging studies: CBC:  Recent Labs Lab 05/21/17 2242  WBC 8.4  NEUTROABS 6.6  HGB 11.3*  HCT 32.8*  MCV 90.9  PLT 161   Basic Metabolic Panel:  Recent Labs Lab 05/21/17 2242  NA 137  K 3.2*  CL 102  CO2 25  GLUCOSE 122*  BUN 20  CREATININE 0.85  CALCIUM 9.1   GFR: CrCl cannot be calculated (Unknown ideal weight.).    Radiological Exams on Admission: Personally reviewed X-ray reports: Dg Knee Complete 4 Views Right  Result Date: 05/21/2017 CLINICAL  DATA:  Status post fall, with right knee pain. Initial encounter. EXAM: RIGHT KNEE - COMPLETE 4+ VIEW COMPARISON:  None. FINDINGS: There is no evidence of fracture or dislocation. The joint spaces are preserved. No significant degenerative change is seen; the patellofemoral joint is grossly unremarkable in appearance. Chondrocalcinosis is noted. No significant joint effusion is seen. The visualized soft tissues are normal in appearance. IMPRESSION: 1. No evidence of fracture or dislocation. 2. Chondrocalcinosis noted. Electronically Signed   By: Roanna RaiderJeffery  Chang M.D.   On: 05/21/2017 22:26   Dg Hip Unilat With Pelvis 2-3 Views Right  Result Date: 05/21/2017 CLINICAL DATA:  Status post fall, with right hip pain. Initial encounter. EXAM: DG HIP (WITH OR WITHOUT PELVIS) 2-3V RIGHT COMPARISON:  None. FINDINGS: There appears to be a minimally displaced right femoral intertrochanteric fracture. Both femoral heads are seated normally within their respective acetabula. The proximal right femur appears intact. Mild degenerative change is noted at the lower lumbar spine. The sacroiliac joints are unremarkable in  appearance. The visualized bowel gas pattern is grossly unremarkable in appearance. IMPRESSION: Minimally displaced right femoral intertrochanteric fracture suspected. Electronically Signed   By: Roanna RaiderJeffery  Chang M.D.   On: 05/21/2017 22:26    EKG: Independently reviewed. Rate 62, QTc 447, no ST changes.         Assessment and Plan: 1. Hip fracture: The patient will be seen by Dr. Dion [facility]ucierLandau in the [facility], to evaluate for operative fixation of the RIGHT hip.   -Admit to med-surg bed -Hydrocodone-acetaminophen or morphine as tolerated for pain -Bed rest, apply ice, document sedation and vitals per Hip fracture protocol -Cardiac diet, surgery planned 8.14 per Dr. Shelba FlakeLandau's note -Nutrition consulted -Hold following meds:  -HCTZ on the morning of surgery   Overall, the patient is at intermediate risk for the planned surgery.  Patient has a RCRI score of 1. The patient had some atypical chest pain after the event, brief and resolved spontaneously, no new murmurs.  Despite that her functional capacity is low, if her troponin and ECG are unremarkable, no further testing needed.      Post-operative medical care: Per AAOS 2014 guidelines on hip fractures in the elderly: -Recommend osteoporosis screening after discharge if not done previously -Recommend vitamin D 800 IUand calcium 1200 mg supplementation after discharge  2. HTN: -Continue Coreg, HCTZ  3. History of hemorrhagic stroke with seizures: -Continue Keppra  4. Anemia: -Transfusion threshold 8 mg/dL. -Continue iron  5. Hypokalemia: -Oral K -Check Mag  6. Dementia: -Continue Buspar, sertraline Delirium precautions:   -Lights and TV off, minimize interruptions at night  -Blinds open and lights on during day  -Glasses/hearing aid with patient  -Frequent reorientation  -PT/OT when able  -Avoid sedation medications/Beers list medications         DVT prophylaxis: SCDs  Code Status: FULL  Family  Communication: Daughter and son in law at bedside  Disposition Plan: Anticipate evaluation by Orthopedics and surgical fixation likely 8.14, then PT evaluation and discharge to SNF in 2-3 days. Admission status: INPATIENT for hip fracture, medical surgical bed     Medical decision making and consults: Patient seen at 11:25 PM on 05/21/2017.  The patient was discussed with [place] Russo, PA-C. What exists of the patient's chart was reviewed in depth and summarized above.  Clinical condition: stable.      Alberteen SamChristopher P Amyjo Mizrachi [facility] Pager [phone]

## 2017-05-22 NOTE — H&P (View-Only) (Signed)
Patient's family updated.  [facility]    The risks benefits and alternatives were discussed with the patient including but not limited to the risks of nonoperative treatment, versus surgical intervention including infection, bleeding, nerve injury, malunion, nonunion, the need for revision surgery, hardware prominence, hardware failure, the need for hardware removal, blood clots, cardiopulmonary complications, morbidity, mortality, among others, and they were willing to proceed.    She has had a stroke earlier this year, we will have to determine the best form of DVT prophylaxis.  Will appreciate [facility] guidance/input on this.   Shereen Marton P, MD  

## 2017-05-22 NOTE — Progress Notes (Signed)
PROGRESS NOTE    Lynn Reynolds  JYN:[id number] DOB: Nov 05, 1930 DOA: 05/21/2017 PCP: Patient, No Pcp Per   Brief Narrative: Lynn BallHarriett Valbuena is a 83 y.o. 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 She presented with hip pain after a fall and found to have a right hip fracture.   Assessment & Plan:   Principal Problem:   Closed intertrochanteric fracture of hip, right, initial encounter [facility](HCC) Active Problems:   Essential hypertension   Seizures (HCC)   Alzheimer's type dementia with late onset without behavioral disturbance   Normocytic anemia   Hypokalemia   Right hip fracture S/p fall. Orthopedic surgery consulted on admission. -orthopedic surgery recommendations: surgery today -NPO -morphine prn  Essential hypertension Elevated on admission. Elevated this morning. -reorder hydrochlorothiazide -continue Coreg  Dementia Patient in a memory care unit. -delirium precautions -continue Buspar and sertraline  Anemia Stable. Trended down with IV fluids  History of hemorrhagic stroke Seizures -continue Keppra  Hypokalemia Given supplementation  Thrombocytopenia Likely transient with fluids    DVT prophylaxis: SCDs Code Status: Full code Family Communication: Daughter at bedside Disposition Plan: Discharge likely in 48-72 hours pending surgery   Consultants:   Orthopedic surgery  Procedures:   None  Antimicrobials:  None    Subjective: Pain controlled  Objective: Vitals:   05/22/17 0430 05/22/17 0500 05/22/17 0600 05/22/17 0700  BP: (!) 153/71 (!) 153/64 (!) 148/71 (!) 152/65  Pulse: 65 65 64 65  Resp: 16 20 19 15   Temp:      TempSrc:      SpO2: 97% 96% 98% 97%   No intake or output data in the 24 hours ending 05/22/17 0907 There were no vitals filed for this visit.  Examination:  General exam: Appears calm and comfortable Respiratory system: Clear to auscultation.  Respiratory effort normal. Cardiovascular system: S1 & S2 heard, RRR. No murmurs, rubs, gallops or clicks. Gastrointestinal system: Abdomen is nondistended, soft and nontender. No organomegaly or masses felt. Normal bowel sounds heard. Central nervous system: Alert and oriented. No focal neurological deficits. Extremities: No edema. No calf tenderness. No leg-length discrepancy. No internal/external rotation. Skin: No cyanosis. No rashes Psychiatry: Judgement and insight appear normal. Mood & affect appropriate.     Data Reviewed: I have personally reviewed following labs and imaging studies  CBC:  Recent Labs Lab 05/21/17 2242 05/22/17 0419  WBC 8.4 6.0  NEUTROABS 6.6  --   HGB 11.3* 10.7*  HCT 32.8* 31.5*  MCV 90.9 90.0  PLT 161 139*   Basic Metabolic Panel:  Recent Labs Lab 05/21/17 2242 05/22/17 0419  NA 137 135  K 3.2* 3.2*  CL 102 102  CO2 25 26  GLUCOSE 122* 120*  BUN 20 17  CREATININE 0.85 0.80  CALCIUM 9.1 8.7*  MG  --  1.8   GFR: CrCl cannot be calculated (Unknown ideal weight.). Liver Function Tests: No results for input(s): AST, ALT, ALKPHOS, BILITOT, PROT, ALBUMIN in the last 168 hours. No results for input(s): LIPASE, AMYLASE in the last 168 hours. No results for input(s): AMMONIA in the last 168 hours. Coagulation Profile: No results for input(s): INR, PROTIME in the last 168 hours. Cardiac Enzymes:  Recent Labs Lab 05/22/17 0419  TROPONINI <0.03   BNP (last 3 results) No results for input(s): PROBNP in the last 8760 hours. HbA1C: No results for input(s): HGBA1C in the last 72 hours. CBG: No results for input(s): GLUCAP in the last 168  hours. Lipid Profile: No results for input(s): CHOL, HDL, LDLCALC, TRIG, CHOLHDL, LDLDIRECT in the last 72 hours. Thyroid Function Tests: No results for input(s): TSH, T4TOTAL, FREET4, T3FREE, THYROIDAB in the last 72 hours. Anemia Panel: No results for input(s): VITAMINB12, FOLATE, FERRITIN, TIBC, IRON,  RETICCTPCT in the last 72 hours. Sepsis Labs: No results for input(s): PROCALCITON, LATICACIDVEN in the last 168 hours.  No results found for this or any previous visit (from the past 240 hour(s)).       Radiology Studies: Dg Knee Complete 4 Views Right  Result Date: 05/21/2017 CLINICAL DATA:  Status post fall, with right knee pain. Initial encounter. EXAM: RIGHT KNEE - COMPLETE 4+ VIEW COMPARISON:  None. FINDINGS: There is no evidence of fracture or dislocation. The joint spaces are preserved. No significant degenerative change is seen; the patellofemoral joint is grossly unremarkable in appearance. Chondrocalcinosis is noted. No significant joint effusion is seen. The visualized soft tissues are normal in appearance. IMPRESSION: 1. No evidence of fracture or dislocation. 2. Chondrocalcinosis noted. Electronically Signed   By: Roanna Raider M.D.   On: 05/21/2017 22:26   Dg Hip Unilat With Pelvis 2-3 Views Right  Result Date: 05/21/2017 CLINICAL DATA:  Status post fall, with right hip pain. Initial encounter. EXAM: DG HIP (WITH OR WITHOUT PELVIS) 2-3V RIGHT COMPARISON:  None. FINDINGS: There appears to be a minimally displaced right femoral intertrochanteric fracture. Both femoral heads are seated normally within their respective acetabula. The proximal right femur appears intact. Mild degenerative change is noted at the lower lumbar spine. The sacroiliac joints are unremarkable in appearance. The visualized bowel gas pattern is grossly unremarkable in appearance. IMPRESSION: Minimally displaced right femoral intertrochanteric fracture suspected. Electronically Signed   By: Roanna Raider M.D.   On: 05/21/2017 22:26        Scheduled Meds: . busPIRone  7.5 mg Oral BID  . carvedilol  12.5 mg Oral BID WC  . docusate sodium  100 mg Oral BID  . ferrous sulfate  325 mg Oral BID  . levETIRAcetam  500 mg Oral BID  . potassium chloride  40 mEq Oral BID  . senna-docusate  1 tablet Oral Daily    . sertraline  50 mg Oral Daily   Continuous Infusions:   LOS: 1 day     Jacquelin Hawking, MD [facility] 05/22/2017, 9:07 AM Pager: [phone]  If 7PM-7AM, please contact night-coverage [email or web address] Password [email or web address] 05/22/2017, 9:07 AM

## 2017-05-22 NOTE — Interval H&P Note (Signed)
History and Physical Interval Note:  05/22/2017 12:08 PM  Lynn Reynolds  has presented today for surgery, with the diagnosis of Right Hip Fracture  The various methods of treatment have been discussed with the patient and family. After consideration of risks, benefits and other options for treatment, the patient has consented to  Procedure(s): INTRAMEDULLARY (IM) NAIL FEMORAL (Right) as a surgical intervention .  The patient's history has been reviewed, patient examined, no change in status, stable for surgery.  I have reviewed the patient's chart and labs.  Questions were answered to the patient's satisfaction.     Nestor LewandowskyOWAN,Salome Cozby J

## 2017-05-22 NOTE — Anesthesia Procedure Notes (Signed)
Spinal  Patient location during procedure: OR Start time: 05/22/2017 1:15 PM End time: 05/22/2017 1:30 PM Staffing Anesthesiologist: Lanesha Azzaro Preanesthetic Checklist Completed: patient identified, site marked, surgical consent, pre-op evaluation, timeout performed, IV checked, risks and benefits discussed and monitors and equipment checked Spinal Block Patient position: left lateral decubitus Prep: DuraPrep Patient monitoring: heart rate, cardiac monitor, continuous pulse ox and blood pressure Approach: midline Location: L4-5 Injection technique: single-shot Needle Needle type: Sprotte  Needle gauge: 22 G Needle length: 9 cm Assessment Sensory level: T4 Events: failed spinal Additional Notes Multiple attempts L2-L4 without success.  Move to GAET

## 2017-05-22 NOTE — Discharge Instructions (Addendum)
Hip Fracture A hip fracture is a fracture of the upper part of your thigh bone (femur). What are the causes? A hip fracture is caused by a direct blow to the side of your hip. This is usually the result of a fall but can occur in other circumstances, such as an automobile accident. What increases the risk? There is an increased risk of hip fractures in people with:  An unsteady walking pattern (gait) and those with conditions that contribute to poor balance, such as Parkinson's disease or dementia.  Osteopenia and osteoporosis.  Cancer that spreads to the leg bones.  Certain metabolic diseases.  What are the signs or symptoms? Symptoms of hip fracture include:  Pain over the injured hip.  Inability to put weight on the leg in which the fracture occurred (although, some patients are able to walk after a hip fracture).  Toes and foot of the affected leg point outward when you lie down.  How is this diagnosed? A physical exam can determine if a hip fracture is likely to have occurred. X-ray exams are needed to confirm the fracture and to look for other injuries. The X-ray exam can help to determine the type of hip fracture. Rarely, the fracture is not visible on an X-ray image and a CT scan or MRI will have to be done. How is this treated? The treatment for a fracture is usually surgery. This means using a screw, nail, or rod to hold the bones in place. Follow these instructions at home: Take all medicines as directed by your health care provider. Contact a health care provider if: Pain continues, even after taking pain medicine. This information is not intended to replace advice given to you by your health care provider. Make sure you discuss any questions you have with your health care provider. Document Released: 09/26/2005 Document Revised: 03/03/2016 Document Reviewed: 05/08/2013 Elsevier Interactive Patient Education  2017 [employer].   Open Reduction and Internal Fixation  for Hip Fracture, Care After Refer to this sheet in the next few weeks. These instructions provide you with information about caring for yourself after your procedure. Your health care provider may also give you more specific instructions. Your treatment has been planned according to current medical practices, but problems sometimes occur. Call your health care provider if you have any problems or questions after your procedure. What can I expect after the procedure? After your procedure, it is typical to have the following:  Pain.  Swelling.  Difficulty walking.  Follow these instructions at home:  Have someone help you with everyday activities, such as showering and meals, for the first week after you leave the hospital or as directed.  Take medicines only as directed by your health care provider. Do not take any over-the-counter medicines without approval from your health care provider. Medicines such as aspirin and ibuprofen can increase your risk of bleeding.  Constipation is common after surgery from the pain medicines used and the decrease in your activity level. It is important to drink fluids and to increase your intake of fruits and vegetables. Stool softeners may be prescribed by your health care provider.  There are many different ways to close and cover an incision, including stitches, skin glue, and adhesive strips. Follow your health care provider's instructions about: ? Incision care. ? Bandage (dressing) changes and removal. ? Incision closure removal.  Wear compression stockings as directed by your health care provider. These stockings help to prevent blood clots and reduce swelling in your  legs.  Do not take baths, swim, or use a hot tub until your health care provider approves.  Use crutches or a walker as directed by your health care provider.  Be sure to do any exercises that your physical therapist suggests. These exercises will help to make your hip stronger and  help you to recover more quickly.  Ask your health care provider when you can resume other activities, such as work, driving, and sex.  Do not drive or operate heavy machinery while taking pain medicine.  Keep all follow-up visits as directed by your health care provider. This is important. Contact a health care provider if:  You have fatigue.  You feel weak.  Your pain is not relieved by medicines.  You have a fever.  You have drainage, redness, swelling, or pain at your incision. Get help right away if:  Your incision is bleeding.  Your leg or foot is painful and swollen.  Your leg is pale or blue.  Your leg is cold.  Your leg tingles or is numb.  You have trouble breathing.  You have chest pain. This information is not intended to replace advice given to you by your health care provider. Make sure you discuss any questions you have with your health care provider. Document Released: 04/23/2014 Document Revised: 02/29/2016 Document Reviewed: 02/27/2014 Elsevier Interactive Patient Education  2017 [employer]

## 2017-05-22 NOTE — Progress Notes (Signed)
83 year old with right intertrochanteric hip fracture.   Will need surgery to mobilize.  Likely 8/14 if optimized.   Full consult to follow.   Eulas PostLANDAU,Enzio Buchler P, MD

## 2017-05-22 NOTE — Progress Notes (Addendum)
Dr Tacy Duraddono informed  pt had 3 bites of apple sauce this am at 0630. Betadine swab complete to nares.

## 2017-05-22 NOTE — Transfer of Care (Signed)
Immediate Anesthesia Transfer of Care Note  Patient: Syrianna Harriss  Procedure(s) Performed: Procedure(s): INTRAMEDULLARY (IM) NAIL FEMORAL (Right)  Patient Location: PACU  Anesthesia Type:General  Level of Consciousness: awake - confused but baseline   Airway & Oxygen Therapy: Patient Spontanous Breathing and Patient connected to face mask oxygen  Post-op Assessment: Report given to RN and Post -op Vital signs reviewed and stable  Post vital signs: Reviewed and stable  Last Vitals:  Vitals:   05/22/17 1036 05/22/17 1439  BP: (!) 152/74 (!) 151/85  Pulse: 70 78  Resp: 16 17  Temp:  36.8 C  SpO2: 100% 98%    Last Pain:  Vitals:   05/22/17 0828  TempSrc:   PainSc: 0-No pain         Complications: No apparent anesthesia complications

## 2017-05-22 NOTE — Op Note (Signed)
PREOPERATIVE DIAGNOSIS: R hip intertrochanteric fracture 2-part with varus displacement  POSTOPERATIVE DIAGNOSIS: Same  PROCEDURE: Open reduction internal fixation left hip intertrochanteric fracture using a Biomet 360mm x 11mm affixus nail , 90 mm lag screw keyed  SURGEON: Jalayla Chrismer J  ASSISTANT: Eric K. Gaylene BrooksPhillips PA-C (present throughout entire procedure and necessary for timely completion of the procedure) ANESTHESIA: General  BLOOD LOSS: 200cc  FLUID REPLACEMENT: 1500 cc crystalloid  DRAINS: Foley Catheter  URINE OUTPUT: 300cc  COMPLICATIONS: none   INDICATIONS FOR PROCEDURE: Rhip intertrochanteric fracture, 2-part, sustained from a fall. Patient. presented to the emergency room, was admitted by the medicine service and orthopedic consultation was obtained. To decrease pain and increase function we have recommended open reduction internal fixation using locked trochanteric nail. The risks, benefits, and alternatives were discussed at length including but not limited to the risks of infection, bleeding, nerve injury, stiffness, blood clots, the need for revision surgery, cardiopulmonary complications, among others, and they were willing to proceed. Benefits have been discussed. Questions answered.   PROCEDURE IN DETAIL: The patient was identified by armband,  received preoperative IV antibiotics in the holding area, taken to the operating room , appropriate anesthetic monitors were attached and general endotracheal anesthesia induced. Pt. was then transferred to a radiolucent flat Jackson table, rolled into the L lateral decubitus position and fixed there with a Stulberg Mark 2 pelvic clamp. Under C-arm imaging control we then performed a closed reduction with abduction and internal rotation obtaining an acceptable reduction with the leg in full extension. The left lower extremity was then prepped and draped in usual sterile fashion in the iliac crest to the ankle. A timeout procedure  was performed. Under C-arm image control a line was drawn coaxially with the femur on the lateral view, a stab wound was made 8 cm proximal to the tip of the greater trochanter along the line that had been drawn, and a guide pin was inserted into the tip of the greater trochanter and then down the shaft of the femur pass intertrochanteric fracture. We then placed the ball-tipped guidewire, overreamed with the Biomet pilot reamer and then flexibly reamed up to 12.5 mm obtaining some early chatter at midshaft. With the ball-tipped guidewire in place we measured for 360 mm x 11 mm Biomet affixes trochanteric nail which was loaded on the inserter placed over the guidewire and down the femur to the appropriate depth that allowed us to place the lag bolt into the center of the femoral head, slightly inferior. With the C-arm set for the lateral view we then made it so that the tip of the guide pin bisected the femoral head and inserted the guide pin through the lateral flare of the inner trochanter fracture through the rod and up into the femoral head using the driving platform guide holes. We measured for a 90 mm lag screw and then reamed over the guidewire with the reamer set for 90 mm. We then loaded a 90 mm Biomet affixes lag screw and inserted it through the lateral flare of the femur through the rod and up into the femoral head to a point it was 8 mm below the subchondral bone on the AP and lateral views. At this point the locking screw was inserted and tightened and then backed off a quarter turn to allow compression. Final C-arm images were taken of the construct the hip was taken through flexion and internal and external rotation under image intensification to assure that the lag bolt did not  enter the joint. At this point stab wounds and the rod insertion incision were irrigated out normal saline solution, and the subcutaneous tissue closed with 2-0 undyed Vicryl suture and the skin with staples. A dressing of  Mepilex was then applied the patient was unclamped rolled supine awakened extubated and taken to the recovery room without difficulty.  Gean Birchwood J  05/17/2012, 7:29 PM

## 2017-05-22 NOTE — ED Notes (Signed)
Report attempted x 1

## 2017-05-23 ENCOUNTER — Encounter (HOSPITAL_COMMUNITY): Payer: Self-pay | Admitting: General Practice

## 2017-05-23 DIAGNOSIS — S72001A Fracture of unspecified part of neck of right femur, initial encounter for closed fracture: Secondary | ICD-10-CM

## 2017-05-23 LAB — BASIC METABOLIC PANEL
ANION GAP: 5 (ref 5–15)
BUN: 12 mg/dL (ref 6–20)
CO2: 25 mmol/L (ref 22–32)
Calcium: 8.1 mg/dL — ABNORMAL LOW (ref 8.9–10.3)
Chloride: 102 mmol/L (ref 101–111)
Creatinine, Ser: 0.75 mg/dL (ref 0.44–1.00)
GFR calc Af Amer: >60 mL/min (ref >60)
GLUCOSE: 122 mg/dL — AB (ref 65–99)
POTASSIUM: 4.4 mmol/L (ref 3.5–5.1)
SODIUM: 132 mmol/L — AB (ref 135–145)

## 2017-05-23 LAB — CBC
HCT: 29.2 % — ABNORMAL LOW (ref 36.0–46.0)
Hemoglobin: 9.5 g/dL — ABNORMAL LOW (ref 12.0–15.0)
MCH: 30 pg (ref 26.0–34.0)
MCHC: 32.5 g/dL (ref 30.0–36.0)
MCV: 92.1 fL (ref 78.0–100.0)
PLATELETS: 124 10*3/uL — AB (ref 150–400)
RBC: 3.17 MIL/uL — AB (ref 3.87–5.11)
RDW: 14.7 % (ref 11.5–15.5)
WBC: 5.4 10*3/uL (ref 4.0–10.5)

## 2017-05-23 MED ORDER — CARVEDILOL 12.5 MG PO TABS
12.5000 mg | ORAL_TABLET | Freq: Two times a day (BID) | ORAL | Status: DC
  Administered 2017-05-23 – 2017-05-25 (×4): 12.5 mg via ORAL
  Filled 2017-05-23 (×4): qty 1

## 2017-05-23 MED ORDER — BUSPIRONE HCL 5 MG PO TABS
7.5000 mg | ORAL_TABLET | Freq: Two times a day (BID) | ORAL | Status: DC
  Administered 2017-05-23 – 2017-05-25 (×5): 7.5 mg via ORAL
  Filled 2017-05-23 (×5): qty 2

## 2017-05-23 MED ORDER — ENSURE ENLIVE PO LIQD
237.0000 mL | Freq: Every day | ORAL | Status: DC
  Administered 2017-05-24: 237 mL via ORAL

## 2017-05-23 NOTE — NC FL2 (Signed)
Center MEDICAID FL2 LEVEL OF CARE SCREENING TOOL     IDENTIFICATION  Patient Name: Lynn BallHarriett Liendo Birthdate: April 08, 1931 Sex: female Admission Date (Current Location): 05/21/2017  [facility] [place]   Facility and Address:  [facility], [address], [place], [place] [address]      Provider Number: [id number]  Attending Physician Name and Address:  Narda BondsNettey, Ralph A, MD  Relative Name and Phone Number:  daughter, Leafy Halfam Powell, [phone]    Current Level of Care: Hospital Recommended Level of Care: Skilled Nursing Facility Prior Approval Number:    Date Approved/Denied: 05/23/17 PASRR Number: pending  Discharge Plan: SNF    Current Diagnoses: Patient Active Problem List   Diagnosis Date Noted  . Essential hypertension 05/22/2017  . Seizures (HCC) 05/22/2017  . Alzheimer's type dementia with late onset without behavioral disturbance 05/22/2017  . Normocytic anemia 05/22/2017  . Hypokalemia 05/22/2017  . Closed intertrochanteric fracture of hip, right, initial encounter (HCC) 05/21/2017    Orientation RESPIRATION BLADDER Height & Weight     Self  Normal Continent Weight:   Height:     BEHAVIORAL SYMPTOMS/MOOD NEUROLOGICAL BOWEL NUTRITION STATUS      Continent Diet (See Dc Summary)  AMBULATORY STATUS COMMUNICATION OF NEEDS Skin   Limited Assist Verbally Surgical wounds (Right Hip Closed Silver Hydrofiber)                       Personal Care Assistance Level of Assistance              Functional Limitations Info             SPECIAL CARE FACTORS FREQUENCY  PT (By licensed PT)     PT Frequency: 3x week              Contractures      Additional Factors Info  Code Status, Allergies, Psychotropic, Isolation Precautions Code Status Info: Full code Allergies Info: Latex Psychotropic Info: Buspar, Zoloft   Isolation Precautions Info: MRSA     Current Medications (05/23/2017):  This is the  current hospital active medication list Current Facility-Administered Medications  Medication Dose Route Frequency Provider Last Rate Last Dose  . acetaminophen (TYLENOL) tablet 650 mg  650 mg Oral Q6H PRN Allena Katzhillips, Eric K, PA-C   650 mg at 05/23/17 [phone]   Or  . acetaminophen (TYLENOL) suppository 650 mg  650 mg Rectal Q6H PRN Allena KatzPhillips, Eric K, PA-C      . aspirin EC tablet 325 mg  325 mg Oral BID WC Dannielle BurnPhillips, Eric K, PA-C   325 mg at 05/23/17 0959  . busPIRone (BUSPAR) tablet 7.5 mg  7.5 mg Oral BID Narda BondsNettey, Ralph A, MD   7.5 mg at 05/23/17 0959  . carvedilol (COREG) tablet 12.5 mg  12.5 mg Oral BID WC Narda BondsNettey, Ralph A, MD   12.5 mg at 05/23/17 0958  . dextrose 5 % and 0.45 % NaCl with KCl 20 mEq/L infusion   Intravenous Continuous Allena Katzhillips, Eric K, PA-C 100 mL/hr at 05/23/17 [phone]    . docusate sodium (COLACE) capsule 100 mg  100 mg Oral BID Alberteen Samanford, Christopher P, MD   100 mg at 05/23/17 0959  . ferrous sulfate tablet 325 mg  325 mg Oral BID Alberteen Samanford, Christopher P, MD   325 mg at 05/23/17 0958  . hydrochlorothiazide (MICROZIDE) capsule 12.5 mg  12.5 mg Oral Daily Narda BondsNettey, Ralph A, MD   12.5 mg at 05/23/17 0958  .  levETIRAcetam (KEPPRA) tablet 500 mg  500 mg Oral BID Alberteen Sam, MD   500 mg at 05/23/17 0959  . menthol-cetylpyridinium (CEPACOL) lozenge 3 mg  1 lozenge Oral PRN Allena Katz, PA-C       Or  . phenol (CHLORASEPTIC) mouth spray 1 spray  1 spray Mouth/Throat PRN Allena Katz, PA-C      . metoCLOPramide (REGLAN) tablet 5-10 mg  5-10 mg Oral Q8H PRN Allena Katz, PA-C       Or  . metoCLOPramide (REGLAN) injection 5-10 mg  5-10 mg Intravenous Q8H PRN Allena Katz, PA-C      . morphine 4 MG/ML injection 0.52 mg  0.52 mg Intravenous Q2H PRN Allena Katz, PA-C      . ondansetron [facility]) tablet 4 mg  4 mg Oral Q6H PRN Allena Katz, PA-C       Or  . ondansetron [facility]) injection 4 mg  4 mg Intravenous Q6H PRN Dannielle Burn K, PA-C      . polyethylene  glycol (MIRALAX / GLYCOLAX) packet 17 g  17 g Oral Daily PRN Danford, Earl Lites, MD      . senna-docusate (Senokot-S) tablet 1 tablet  1 tablet Oral Daily Alberteen Sam, MD   1 tablet at 05/23/17 [phone]  . sertraline (ZOLOFT) tablet 50 mg  50 mg Oral Daily Danford, Earl Lites, MD   50 mg at 05/23/17 9147     Discharge Medications: Please see discharge summary for a list of discharge medications.  Relevant Imaging Results:  Relevant Lab Results:   Additional Information Ss#:[phone]  Tresa Moore, LCSW

## 2017-05-23 NOTE — Evaluation (Signed)
Physical Therapy Evaluation Patient Details Name: Lynn Reynolds MRN: [id number] DOB: 09/29/1931 Today's Date: 05/23/2017   History of Present Illness  83 yo admitted from ALF memory care after fall playing game with right hip fx s/p ORIF. 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 Pt motivated by breakfast and coffee to get OOB. Pt with decreased strength, cognition, balance, function and gait who will benefit from acute therapy to maximize mobility, function and independence to decrease burden of care. Daughter present throughout and pt moving well with limited activity tolerance. Encouraged mobility to [facility] and toilet with nursing assist as well as OOB daily.     Follow Up Recommendations SNF;Supervision/Assistance - 24 hour    Equipment Recommendations  Rolling walker with 5" wheels;3in1 (PT)    Recommendations for Other Services       Precautions / Restrictions Precautions Precautions: Fall Restrictions RLE Weight Bearing: Weight bearing as tolerated      Mobility  Bed Mobility Overal bed mobility: Needs Assistance Bed Mobility: Supine to Sit     Supine to sit: Mod assist     General bed mobility comments: increased time with use of rails, cues for sequence and assist to bring legs off of bed and elevate trunk  Transfers Overall transfer level: Needs assistance   Transfers: Sit to/from Stand Sit to Stand: Min assist         General transfer comment: multimodal cues for hand placement, sequence, safety and assist to rise from bed and [facility]  Ambulation/Gait Ambulation/Gait assistance: Min assist;+2 safety/equipment Ambulation Distance (Feet): 15 Feet Assistive device: Rolling walker (2 wheeled) Gait Pattern/deviations: Step-to pattern;Decreased stride length;Trunk flexed   Gait velocity interpretation: Below normal speed for age/gender General Gait Details: cues for sequence, assist to direct RW, pt  able to walk 6' to [facility] then additional 15' to chair. Pt reports soreness throughout  Stairs            Wheelchair Mobility    Modified Rankin (Stroke Patients Only)       Balance Overall balance assessment: Needs assistance   Sitting balance-Leahy Scale: Good       Standing balance-Leahy Scale: Poor                               Pertinent Vitals/Pain Pain Assessment: Faces Faces Pain Scale: Hurts little more Pain Location: soreness right hip Pain Descriptors / Indicators: Sore Pain Intervention(s): Limited activity within patient's tolerance;Repositioned;Monitored during session    Home Living Family/patient expects to be discharged to:: Skilled nursing facility                      Prior Function Level of Independence: Needs assistance   Gait / Transfers Assistance Needed: independent   ADL's / Homemaking Assistance Needed: supervision for bathing, dresses on her own, staff does cooking and cleaning  Comments: she likes to walk around facility     Hand Dominance        Extremity/Trunk Assessment   Upper Extremity Assessment Upper Extremity Assessment: Overall WFL for tasks assessed    Lower Extremity Assessment Lower Extremity Assessment: RLE deficits/detail RLE Deficits / Details: decreased strength post op secondary to pain    Cervical / Trunk Assessment Cervical / Trunk Assessment: Kyphotic  Communication   Communication: HOH  Cognition Arousal/Alertness: Awake/alert Behavior During Therapy: WFL for tasks assessed/performed Overall Cognitive Status: History  of cognitive impairments - at baseline                                 General Comments: daughter present and confirms pt current function as baseline      General Comments      Exercises     Assessment/Plan    PT Assessment Patient needs continued PT services  PT Problem List Decreased strength;Decreased mobility;Decreased safety  awareness;Decreased activity tolerance;Decreased cognition;Decreased balance;Decreased knowledge of use of DME;Pain       PT Treatment Interventions Gait training;Therapeutic exercise;Patient/family education;Functional mobility training;DME instruction;Therapeutic activities    PT Goals (Current goals can be found in the Care Plan section)  Acute Rehab PT Goals Patient Stated Goal: return to walking around facility PT Goal Formulation: With family Time For Goal Achievement: 06/06/17 Potential to Achieve Goals: Good    Frequency Min 3X/week   Barriers to discharge        Co-evaluation               AM-PAC PT "6 Clicks" Daily Activity  Outcome Measure Difficulty turning over in bed (including adjusting bedclothes, sheets and blankets)?: Total Difficulty moving from lying on back to sitting on the side of the bed? : Total Difficulty sitting down on and standing up from a chair with arms (e.g., wheelchair, bedside commode, etc,.)?: Total Help needed moving to and from a bed to chair (including a wheelchair)?: A Little Help needed walking in hospital room?: A Little Help needed climbing 3-5 steps with a railing? : A Lot 6 Click Score: 11    End of Session Equipment Utilized During Treatment: Gait belt Activity Tolerance: Patient tolerated treatment well Patient left: in chair;with chair alarm set;with call bell/phone within reach;with family/visitor present Nurse Communication: Mobility status;Precautions;Weight bearing status PT Visit Diagnosis: Other abnormalities of gait and mobility (R26.89);Difficulty in walking, not elsewhere classified (R26.2);Pain Pain - Right/Left: Right Pain - part of body: Hip    Time: [id number] PT Time Calculation (min) (ACUTE ONLY): 42 min   Charges:   PT Evaluation $PT Eval Moderate Complexity: 1 Mod PT Treatments $Gait Training: 8-22 mins $Therapeutic Activity: 8-22 mins   PT G Codes:        Delaney MeigsMaija Tabor Laquinta Hazell,  PT [phone]   Eura Radabaugh B Yula Crotwell 05/23/2017, 8:29 AM

## 2017-05-23 NOTE — Progress Notes (Signed)
PATIENT ID: Lynn Reynolds  MRN: [id number]  DOB/AGE:  [birth year] / 83 y.o.  1 Day Post-Op Procedure(s) (LRB): INTRAMEDULLARY (IM) NAIL FEMORAL (Right)    PROGRESS NOTE Subjective: Patient is alert, oriented, no Nausea, no Vomiting, yes passing gas, . Taking PO well. Denies SOB, Chest or Calf Pain. PAS in place. Ambulate WBAT Patient reports pain as  3/10  .    Objective: Vital signs in last 24 hours: Vitals:   05/22/17 1508 05/22/17 2000 05/23/17 0013 05/23/17 0408  BP: (!) 128/92 (!) 151/69 (!) 134/50 (!) 142/50  Pulse: 77 80 74 73  Resp: 18 18 18 18   Temp: 98.3 F (36.8 C) 98.1 F (36.7 C) 98.5 F (36.9 C) 98.7 F (37.1 C)  TempSrc:  Oral Oral Oral  SpO2: 99% 100% 97% 95%      Intake/Output from previous day: I/O last 3 completed shifts: In: 1250 [I.V.:1000; IV Piggyback:250] Out: 900 [Urine:700; Blood:200]   Intake/Output this shift: No intake/output data recorded.   LABORATORY DATA:  Recent Labs  05/22/17 0419 05/23/17 0321  WBC 6.0 5.4  HGB 10.7* 9.5*  HCT 31.5* 29.2*  PLT 139* 124*  NA 135 132*  K 3.2* 4.4  CL 102 102  CO2 26 25  BUN 17 12  CREATININE 0.80 0.75  GLUCOSE 120* 122*  CALCIUM 8.7* 8.1*    Examination: Neurologically intact ABD soft Neurovascular intact Sensation intact distally Intact pulses distally Dorsiflexion/Plantar flexion intact Incision: dressing C/D/I No cellulitis present Compartment soft} XR AP&Lat of hip shows well placed\fixed Trochanteric nail Assessment:   1 Day Post-Op Procedure(s) (LRB): INTRAMEDULLARY (IM) NAIL FEMORAL (Right) ADDITIONAL DIAGNOSIS:  Expected Acute Blood Loss Anemia, Hypertension,Moderate Alzheimer's disease, history of anemia, history of seizures  Plan: PT/OT WBAT  DVT Prophylaxis: SCDx72 hrs, ASA 325 mg BID x 2 weeks  DISCHARGE PLAN: Skilled Nursing Facility/Rehab  DISCHARGE NEEDS: Physical therapy, patient should use a walker full time. Will need follow-up with orthopedics in 2 weeks  to examine the wounds.  Patient ID: Lynn Reynolds, female   DOB: [birth year], 83 y.o.   MRN: [id number]

## 2017-05-23 NOTE — Progress Notes (Signed)
PROGRESS NOTE    Lynn Reynolds  YCX:[id number] DOB: 09/30/1931 DOA: 05/21/2017 PCP: Patient, No Pcp Per   Brief Narrative: Lynn Reynolds is a 83 y.o. 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 She presented with hip pain after a fall and found to have a right hip fracture.   Assessment & Plan:   Principal Problem:   Closed intertrochanteric fracture of hip, right, initial encounter [facility](HCC) Active Problems:   Essential hypertension   Seizures (HCC)   Alzheimer's type dementia with late onset without behavioral disturbance   Normocytic anemia   Hypokalemia   Right hip fracture S/p fall. Orthopedic surgery consulted on admission. S/p ORIF on 05/22/17. -orthopedic surgery recommendations -PT -morphine prn  Essential hypertension Elevated. Patient's antihypertensives not given yesterday. Coreg discontinued yesterday -contine hydrochlorothiazide -reorder Coreg  Dementia Patient in a memory care unit as an outpatient -delirium precautions -continue Buspar and sertraline  Anemia Stable. Trended down with IV fluids  History of hemorrhagic stroke Seizures -continue Keppra  Hypokalemia Resolved with supplementation.  Thrombocytopenia Down slightly. No bleeding. -repeat CBC    DVT prophylaxis: SCDs Code Status: Full code Family Communication: Daughter at bedside Disposition Plan: Discharge likely in 24-48 hours to SNF   Consultants:   Orthopedic surgery  Procedures:   None  Antimicrobials:  None    Subjective: Soreness of right hip  Objective: Vitals:   05/22/17 1508 05/22/17 2000 05/23/17 0013 05/23/17 0408  BP: (!) 128/92 (!) 151/69 (!) 134/50 (!) 142/50  Pulse: 77 80 74 73  Resp: 18 18 18 18   Temp: 98.3 F (36.8 C) 98.1 F (36.7 C) 98.5 F (36.9 C) 98.7 F (37.1 C)  TempSrc:  Oral Oral Oral  SpO2: 99% 100% 97% 95%    Intake/Output Summary (Last 24 hours) at  05/23/17 0803 Last data filed at 05/23/17 0410  Gross per 24 hour  Intake             1250 ml  Output              900 ml  Net              350 ml   There were no vitals filed for this visit.  Examination:  General exam: Appears calm and comfortable Respiratory system: Clear to auscultation. Respiratory effort normal. Cardiovascular system: S1 & S2 heard, RRR. No murmurs. Gastrointestinal system: Abdomen is nondistended, soft and nontender. Normal bowel sounds heard. Central nervous system: Alert and oriented. No focal neurological deficits. Extremities: No edema. No calf tenderness. Skin: No cyanosis. No rashes Psychiatry: Judgement and insight appear normal. Mood & affect appropriate.     Data Reviewed: I have personally reviewed following labs and imaging studies  CBC:  Recent Labs Lab 05/21/17 2242 05/22/17 0419 05/23/17 0321  WBC 8.4 6.0 5.4  NEUTROABS 6.6  --   --   HGB 11.3* 10.7* 9.5*  HCT 32.8* 31.5* 29.2*  MCV 90.9 90.0 92.1  PLT 161 139* 124*   Basic Metabolic Panel:  Recent Labs Lab 05/21/17 2242 05/22/17 0419 05/23/17 0321  NA 137 135 132*  K 3.2* 3.2* 4.4  CL 102 102 102  CO2 25 26 25   GLUCOSE 122* 120* 122*  BUN 20 17 12   CREATININE 0.85 0.80 0.75  CALCIUM 9.1 8.7* 8.1*  MG  --  1.8  --    GFR: CrCl cannot be calculated (Unknown ideal weight.). Liver Function Tests: No results for input(s):  AST, ALT, ALKPHOS, BILITOT, PROT, ALBUMIN in the last 168 hours. No results for input(s): LIPASE, AMYLASE in the last 168 hours. No results for input(s): AMMONIA in the last 168 hours. Coagulation Profile: No results for input(s): INR, PROTIME in the last 168 hours. Cardiac Enzymes:  Recent Labs Lab 05/22/17 0419  TROPONINI <0.03   BNP (last 3 results) No results for input(s): PROBNP in the last 8760 hours. HbA1C: No results for input(s): HGBA1C in the last 72 hours. CBG: No results for input(s): GLUCAP in the last 168 hours. Lipid  Profile: No results for input(s): CHOL, HDL, LDLCALC, TRIG, CHOLHDL, LDLDIRECT in the last 72 hours. Thyroid Function Tests: No results for input(s): TSH, T4TOTAL, FREET4, T3FREE, THYROIDAB in the last 72 hours. Anemia Panel: No results for input(s): VITAMINB12, FOLATE, FERRITIN, TIBC, IRON, RETICCTPCT in the last 72 hours. Sepsis Labs: No results for input(s): PROCALCITON, LATICACIDVEN in the last 168 hours.  Recent Results (from the past 240 hour(s))  Surgical PCR screen     Status: Abnormal   Collection Time: 05/22/17 10:31 AM  Result Value Ref Range Status   MRSA, PCR POSITIVE (A) NEGATIVE Final    Comment: CRITICAL RESULT CALLED TO, READ BACK BY AND VERIFIED WITH: Holley Dexter, RN AT 1230 ON 05/22/17 BY C. JESSUP, MLT.    Staphylococcus aureus POSITIVE (A) NEGATIVE Final    Comment:        The Xpert SA Assay (FDA approved for NASAL specimens in patients over 54 years of age), is one component of a comprehensive surveillance program.  Test performance has been validated by [facility] for patients greater than or equal to 8 year old. It is not intended to diagnose infection nor to guide or monitor treatment. Performed at [facility], [address], [place], [place] [address]          Radiology Studies: Pelvis Portable  Result Date: 05/22/2017 CLINICAL DATA:  Status post ORIF for right femur fracture EXAM: PORTABLE PELVIS 1-2 VIEWS COMPARISON:  Intraoperative fluoro spot images of today's date and AP view of the right hip of May 21, 2017. FINDINGS: The bony pelvis is subjectively osteopenic. The patient has undergone intramedullary rod and telescoping nail fixation for a nondisplaced intertrochanteric fracture. Alignment is anatomic. No postprocedure complication is observed. IMPRESSION: No postprocedure complication following ORIF for a nondisplaced intertrochanteric fracture of the right femur. Electronically Signed   By: David  [place] M.D.   On: 05/22/2017 15:02    Dg Knee Complete 4 Views Right  Result Date: 05/21/2017 CLINICAL DATA:  Status post fall, with right knee pain. Initial encounter. EXAM: RIGHT KNEE - COMPLETE 4+ VIEW COMPARISON:  None. FINDINGS: There is no evidence of fracture or dislocation. The joint spaces are preserved. No significant degenerative change is seen; the patellofemoral joint is grossly unremarkable in appearance. Chondrocalcinosis is noted. No significant joint effusion is seen. The visualized soft tissues are normal in appearance. IMPRESSION: 1. No evidence of fracture or dislocation. 2. Chondrocalcinosis noted. Electronically Signed   By: Roanna Raider M.D.   On: 05/21/2017 22:26   Dg C-arm 1-60 Min  Result Date: 05/22/2017 CLINICAL DATA:  Right femur ORIF. EXAM: RIGHT FEMUR 2 VIEWS; DG C-ARM 61-120 MIN COMPARISON:  Right femur x-rays dated May 21, 2017. FLUOROSCOPY TIME:  1 minute, 17 seconds. C-arm fluoroscopic images were obtained intraoperatively and submitted for post operative interpretation. FINDINGS: Intraoperative x-rays demonstrate interval cephalomedullary rod fixation of the right femur. Alignment is normal. IMPRESSION: Right femur ORIF  without evidence of hardware complication. Electronically Signed   By: Obie Dredge M.D.   On: 05/22/2017 14:30   Dg Hip Unilat With Pelvis 2-3 Views Right  Result Date: 05/21/2017 CLINICAL DATA:  Status post fall, with right hip pain. Initial encounter. EXAM: DG HIP (WITH OR WITHOUT PELVIS) 2-3V RIGHT COMPARISON:  None. FINDINGS: There appears to be a minimally displaced right femoral intertrochanteric fracture. Both femoral heads are seated normally within their respective acetabula. The proximal right femur appears intact. Mild degenerative change is noted at the lower lumbar spine. The sacroiliac joints are unremarkable in appearance. The visualized bowel gas pattern is grossly unremarkable in appearance. IMPRESSION: Minimally displaced right femoral intertrochanteric fracture  suspected. Electronically Signed   By: Roanna Raider M.D.   On: 05/21/2017 22:26   Dg Femur, Min 2 Views Right  Result Date: 05/22/2017 CLINICAL DATA:  Right femur ORIF. EXAM: RIGHT FEMUR 2 VIEWS; DG C-ARM 61-120 MIN COMPARISON:  Right femur x-rays dated May 21, 2017. FLUOROSCOPY TIME:  1 minute, 17 seconds. C-arm fluoroscopic images were obtained intraoperatively and submitted for post operative interpretation. FINDINGS: Intraoperative x-rays demonstrate interval cephalomedullary rod fixation of the right femur. Alignment is normal. IMPRESSION: Right femur ORIF without evidence of hardware complication. Electronically Signed   By: Obie Dredge M.D.   On: 05/22/2017 14:30        Scheduled Meds: . aspirin EC  325 mg Oral BID WC  . docusate sodium  100 mg Oral BID  . ferrous sulfate  325 mg Oral BID  . hydrochlorothiazide  12.5 mg Oral Daily  . levETIRAcetam  500 mg Oral BID  . potassium chloride  40 mEq Oral BID  . senna-docusate  1 tablet Oral Daily  . sertraline  50 mg Oral Daily   Continuous Infusions: . dextrose 5 % and 0.45 % NaCl with KCl 20 mEq/L 100 mL/hr at 05/22/17 1800     LOS: 2 days     Jacquelin Hawking, MD [facility] 05/23/2017, 8:03 AM Pager: [phone]  If 7PM-7AM, please contact night-coverage [email or web address] Password [email or web address] 05/23/2017, 8:03 AM

## 2017-05-23 NOTE — Social Work (Addendum)
CSW followed up with daughter Lynn Reynolds earlier and provided her a list of SNF that have made bed offers. Daughter called back and indicated she may want her [place] to go [place]. CSW discussed again the insurance process and insurance auth needed to go to any SNF. Daughter voiced understanding and still has not selected SNF. CSW also explained that if patient does not have insurance auth to go to a SNF at [place] we will have to DC her to a SNF that will take patient while insurance Berkley Harveyauth is pending.  CS[place] and left msg on the voicemail for admission staff Lynn Reynolds to call back. CSW called daughter and advised of same and she indicated that she is not able to select a SNF.  4:29pm daughter just called and indicated that she would like for her [facility]. CSW called admission staff and left msg. CSW will continue to f/u as SNF will need to obtain auth for SNF placement.  Keene BreathPatricia Easton Fetty, LCSW Clinical Social Worker [phone]

## 2017-05-23 NOTE — [place] FL2 (Signed)
Center MEDICAID FL2 LEVEL OF CARE SCREENING TOOL     IDENTIFICATION  Patient Name: Lynn Reynolds Birthdate: April 08, 1931 Sex: female Admission Date (Current Location): 05/21/2017  [facility] [place]   Facility and Address:  The [facility], [address], [place], [place] [address]      Provider Number: [id number]  Attending Physician Name and Address:  Narda BondsNettey, Ralph A, MD  Relative Name and Phone Number:  daughter, Leafy Halfam Powell, [phone]    Current Level of Care: Hospital Recommended Level of Care: Skilled Nursing Facility Prior Approval Number:    Date Approved/Denied: 05/23/17 PASRR Number: pending  Discharge Plan: SNF    Current Diagnoses: Patient Active Problem List   Diagnosis Date Noted  . Essential hypertension 05/22/2017  . Seizures (HCC) 05/22/2017  . Alzheimer's type dementia with late onset without behavioral disturbance 05/22/2017  . Normocytic anemia 05/22/2017  . Hypokalemia 05/22/2017  . Closed intertrochanteric fracture of hip, right, initial encounter (HCC) 05/21/2017    Orientation RESPIRATION BLADDER Height & Weight     Self  Normal Continent Weight:   Height:     BEHAVIORAL SYMPTOMS/MOOD NEUROLOGICAL BOWEL NUTRITION STATUS      Continent Diet (See Dc Summary)  AMBULATORY STATUS COMMUNICATION OF NEEDS Skin   Limited Assist Verbally Surgical wounds (Right Hip Closed Silver Hydrofiber)                       Personal Care Assistance Level of Assistance              Functional Limitations Info             SPECIAL CARE FACTORS FREQUENCY  PT (By licensed PT)     PT Frequency: 3x week              Contractures      Additional Factors Info  Code Status, Allergies, Psychotropic, Isolation Precautions Code Status Info: Full code Allergies Info: Latex Psychotropic Info: Buspar, Zoloft   Isolation Precautions Info: MRSA     Current Medications (05/23/2017):  This is the  current hospital active medication list Current Facility-Administered Medications  Medication Dose Route Frequency Provider Last Rate Last Dose  . acetaminophen (TYLENOL) tablet 650 mg  650 mg Oral Q6H PRN Allena Katzhillips, Eric K, PA-C   650 mg at 05/23/17 [id number]   Or  . acetaminophen (TYLENOL) suppository 650 mg  650 mg Rectal Q6H PRN Allena KatzPhillips, Eric K, PA-C      . aspirin EC tablet 325 mg  325 mg Oral BID WC Dannielle BurnPhillips, Eric K, PA-C   325 mg at 05/23/17 0959  . busPIRone (BUSPAR) tablet 7.5 mg  7.5 mg Oral BID Narda BondsNettey, Ralph A, MD   7.5 mg at 05/23/17 0959  . carvedilol (COREG) tablet 12.5 mg  12.5 mg Oral BID WC Narda BondsNettey, Ralph A, MD   12.5 mg at 05/23/17 0958  . dextrose 5 % and 0.45 % NaCl with KCl 20 mEq/L infusion   Intravenous Continuous Allena Katzhillips, Eric K, PA-C 100 mL/hr at 05/23/17 [phone]    . docusate sodium (COLACE) capsule 100 mg  100 mg Oral BID Alberteen Samanford, Christopher P, MD   100 mg at 05/23/17 0959  . ferrous sulfate tablet 325 mg  325 mg Oral BID Alberteen Samanford, Christopher P, MD   325 mg at 05/23/17 0958  . hydrochlorothiazide (MICROZIDE) capsule 12.5 mg  12.5 mg Oral Daily Narda BondsNettey, Ralph A, MD   12.5 mg at 05/23/17 0958  .  levETIRAcetam (KEPPRA) tablet 500 mg  500 mg Oral BID Alberteen Sam, MD   500 mg at 05/23/17 0959  . menthol-cetylpyridinium (CEPACOL) lozenge 3 mg  1 lozenge Oral PRN Allena Katz, PA-C       Or  . phenol (CHLORASEPTIC) mouth spray 1 spray  1 spray Mouth/Throat PRN Allena Katz, PA-C      . metoCLOPramide (REGLAN) tablet 5-10 mg  5-10 mg Oral Q8H PRN Allena Katz, PA-C       Or  . metoCLOPramide (REGLAN) injection 5-10 mg  5-10 mg Intravenous Q8H PRN Allena Katz, PA-C      . morphine 4 MG/ML injection 0.52 mg  0.52 mg Intravenous Q2H PRN Allena Katz, PA-C      . ondansetron [facility]) tablet 4 mg  4 mg Oral Q6H PRN Allena Katz, PA-C       Or  . ondansetron [facility]) injection 4 mg  4 mg Intravenous Q6H PRN Dannielle Burn K, PA-C      . polyethylene  glycol (MIRALAX / GLYCOLAX) packet 17 g  17 g Oral Daily PRN Danford, Earl Lites, MD      . senna-docusate (Senokot-S) tablet 1 tablet  1 tablet Oral Daily Alberteen Sam, MD   1 tablet at 05/23/17 [phone]  . sertraline (ZOLOFT) tablet 50 mg  50 mg Oral Daily Danford, Earl Lites, MD   50 mg at 05/23/17 9147     Discharge Medications: Please see discharge summary for a list of discharge medications.  Relevant Imaging Results:  Relevant Lab Results:   Additional Information Ss#:[phone]  Tresa Moore, LCSW

## 2017-05-23 NOTE — Progress Notes (Signed)
Initial Nutrition Assessment  DOCUMENTATION CODES:   Not applicable  INTERVENTION:  Provide Ensure Enlive po once daily, each supplement provides 350 kcal and 20 grams of protein.  Encourage adequate PO intake.   NUTRITION DIAGNOSIS:   Increased nutrient needs related to  (post op healing) as evidenced by estimated needs.  GOAL:   Patient will meet greater than or equal to 90% of their needs  MONITOR:   PO intake, Supplement acceptance, Labs, Weight trends, Skin, I & O's  REASON FOR ASSESSMENT:   Consult Hip fracture protocol  ASSESSMENT:   83 y.o. 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 She presented with hip pain after a fall and found to have a right hip fracture.  Procedure (8/13): INTRAMEDULLARY (IM) NAIL FEMORAL (Right)  Meal completion has been 50%. Pt reports having a good appetite currently and PTA with usual consumption of at least 3 meals a day. Noted no new weight or height recorded. Family reports pt is 5 feet 1 inch tall and weighs ~97 lbs. RD to order nutritional supplements to aid adequate nutrition as well as in post op healing.   Nutrition-Focused physical exam completed. Findings are no fat depletion, moderate muscle depletion, and mild edema. Depletion likely related to the natural aging process.   Labs and medications reviewed.   Diet Order:  Diet regular Room service appropriate? Yes; Fluid consistency: Thin  Skin:   (Incision on R hip)  Last BM:  8/12  Height:   Ht Readings from Last 1 Encounters:  No data found for Ht  5 feet 1 inch per family report  Weight:   Wt Readings from Last 1 Encounters:  No data found for Wt  97 lbs (44 kg) per family report  Ideal Body Weight:  47.7 kg  BMI:  There is no height or weight on file to calculate BMI.  Estimated Nutritional Needs:   Kcal:  1350-1550  Protein:  50-60 grams  Fluid:  >/= 1.5 L/day  EDUCATION NEEDS:    No education needs identified at this time  Roslyn SmilingStephanie Judah Chevere, MS, RD, LDN Pager # [phone] After hours/ weekend pager # [phone]

## 2017-05-23 NOTE — Evaluation (Signed)
Occupational Therapy Evaluation Patient Details Name: Lynn BallHarriett Liszewski MRN: [id number] DOB: [birth year] Today's Date: 05/23/2017    History of Present Illness 83 yo admitted from ALF memory care after fall playing game with right hip fx s/p ORIF. PMHx: dementia, CVA Feb 2018 with seizures, HTN   Clinical Impression   PTA, pt was living at an ALF memory care unit and was able to perform her BADLs. Currently, pt requiring Mod A for LB ADLs and Min A for functional mobility. Pt would benefit from further acute OT to facilitate safe dc and increase her occupational performance. Recommend dc to SNF for further OT to increase safety and independence with ADLs and functional mobility.     Follow Up Recommendations  SNF;Supervision/Assistance - 24 hour    Equipment Recommendations   (Defer to next venue)    Recommendations for Other Services PT consult     Precautions / Restrictions Precautions Precautions: Fall Restrictions Weight Bearing Restrictions: Yes RLE Weight Bearing: Weight bearing as tolerated      Mobility Bed Mobility Overal bed mobility: Needs Assistance Bed Mobility: Supine to Sit;Sit to Supine     Supine to sit: Mod assist Sit to supine: Mod assist   General bed mobility comments: increased time with use of rails, cues for sequence and assist to bring legs off of bed and elevate trunk  Transfers Overall transfer level: Needs assistance   Transfers: Sit to/from Stand Sit to Stand: Min assist         General transfer comment: multimodal cues for hand placement, sequence, safety and assist to rise from bed and BSC    Balance Overall balance assessment: Needs assistance Sitting-balance support: No upper extremity supported;Feet supported Sitting balance-Leahy Scale: Good     Standing balance support: During functional activity;No upper extremity supported Standing balance-Leahy Scale: Poor Standing balance comment: Required Min A to maitnain balance  without UE support during toilet hygiene                           ADL either performed or assessed with clinical judgement   ADL Overall ADL's : Needs assistance/impaired Eating/Feeding: Set up;Sitting   Grooming: Set up;Sitting   Upper Body Bathing: Sitting;Min guard   Lower Body Bathing: Moderate assistance;Sitting/lateral leans   Upper Body Dressing : Min guard;Sitting   Lower Body Dressing: Sit to/from stand;Minimal assistance Lower Body Dressing Details (indicate cue type and reason): Requiring increased cues due to confusion. Min A for standing balance Toilet Transfer: Minimal assistance;Ambulation;BSC;RW Toilet Transfer Details (indicate cue type and reason): BSC placed by bed to turn and sit. Pt urinary incontinent during transfer Toileting- Clothing Manipulation and Hygiene: Minimal assistance;Sit to/from stand Toileting - Clothing Manipulation Details (indicate cue type and reason): Min A for balance in standing     Functional mobility during ADLs: Cueing for safety;Cueing for sequencing;Minimal assistance General ADL Comments: Pt requiring Mod A for LB ADLs and Min A for functional mobility. Required cues throughout session due to confusion and decreased ST memory     Vision         Perception     Praxis      Pertinent Vitals/Pain Pain Assessment: Faces Faces Pain Scale: Hurts little more Pain Location: soreness right hip Pain Descriptors / Indicators: Sore Pain Intervention(s): Monitored during session;Limited activity within patient's tolerance;Repositioned     Hand Dominance Right   Extremity/Trunk Assessment Upper Extremity Assessment Upper Extremity Assessment: Overall WFL for tasks assessed   Lower  Extremity Assessment Lower Extremity Assessment: Defer to PT evaluation   Cervical / Trunk Assessment Cervical / Trunk Assessment: Kyphotic   Communication Communication Communication: HOH   Cognition Arousal/Alertness:  Awake/alert Behavior During Therapy: WFL for tasks assessed/performed Overall Cognitive Status: History of cognitive impairments - at baseline                                 General Comments: daughter present and confirms pt current function as baseline. Pt able to state why she was in the hospital saying "I fell while playing a stupid balloon game and they did surgery on my hip." Daughter confirmed.    General Comments  Daughter present for session    Exercises     Shoulder Instructions      Home Living Family/patient expects to be discharged to:: Skilled nursing facility                                 Additional Comments: Pt from [facility] - Memory care      Prior Functioning/Environment Level of Independence: Needs assistance  Gait / Transfers Assistance Needed: independent  ADL's / Homemaking Assistance Needed: Performed BADLs with supervision for showering. ALF provided meals   Comments: she likes to walk around facility        OT Problem List: Decreased strength;Decreased range of motion;Decreased activity tolerance;Impaired balance (sitting and/or standing);Decreased cognition;Decreased safety awareness;Decreased knowledge of use of DME or AE;Decreased knowledge of precautions;Pain      OT Treatment/Interventions: Self-care/ADL training;Therapeutic exercise;Energy conservation;DME and/or AE instruction;Therapeutic activities;Patient/family education    OT Goals(Current goals can be found in the care plan section) Acute Rehab OT Goals Patient Stated Goal: return to walking around facility OT Goal Formulation: With patient Time For Goal Achievement: 06/06/17 Potential to Achieve Goals: Good ADL Goals Pt Will Perform Grooming: with min guard assist;standing Pt Will Perform Lower Body Dressing: with min assist;sit to/from stand Pt Will Transfer to Toilet: ambulating;bedside commode;with min guard assist Pt Will Perform Toileting -  Clothing Manipulation and hygiene: with min guard assist;sit to/from stand  OT Frequency: Min 2X/week   Barriers to D/C:            Co-evaluation              AM-PAC PT "6 Clicks" Daily Activity     Outcome Measure Help from another person eating meals?: None Help from another person taking care of personal grooming?: A Little Help from another person toileting, which includes using toliet, bedpan, or urinal?: A Little Help from another person bathing (including washing, rinsing, drying)?: A Lot Help from another person to put on and taking off regular upper body clothing?: A Little Help from another person to put on and taking off regular lower body clothing?: A Lot 6 Click Score: 17   End of Session Equipment Utilized During Treatment: Gait belt;Rolling walker Nurse Communication: Mobility status  Activity Tolerance: Patient tolerated treatment well;Patient limited by pain Patient left: in bed;with call bell/phone within reach;with family/visitor present  OT Visit Diagnosis: Unsteadiness on feet (R26.81);Other abnormalities of gait and mobility (R26.89);Muscle weakness (generalized) (M62.81);History of falling (Z91.81);Pain Pain - Right/Left: Right Pain - part of body: Hip                Time: 1500-1537 OT Time Calculation (min): 37 min Charges:  OT General Charges $OT Visit:  1 Procedure OT Evaluation $OT Eval Low Complexity: 1 Procedure OT Treatments $Self Care/Home Management : 8-22 mins G-Codes:     Kayd Launer MSOT, OTR/L Acute Rehab Pager: [phone] Office: [phone]  Theodoro Grist Kevyn Boquet 05/23/2017, 4:45 PM

## 2017-05-23 NOTE — Clinical Social Work Note (Addendum)
Clinical Social Work Assessment  Patient Details  Name: Lynn Reynolds MRN: [id number] Date of Birth: 07-Apr-1931  Date of referral:  05/23/17               Reason for consult:  Facility Placement                Permission sought to share information with:  Facility Medical sales representativeContact Representative Permission granted to share information::  Yes, Verbal Permission Granted  Name::     Daughter, Ms. Lowell GuitarPowell, makes Engineer, building servicesdecisions  Agency::  SNF  Relationship::     Contact Information:     Housing/Transportation Living arrangements for the past 2 months:  Assisted Living Facility Source of Information:  Adult Children Patient Interpreter Needed:  None Criminal Activity/Legal Involvement Pertinent to Current Situation/Hospitalization:  No - Comment as needed Significant Relationships:  Adult Children, Other Family Members Lives with:  Facility Resident Do you feel safe going back to the place where you live?  No Need for family participation in patient care:  Yes (Comment)  Care giving concerns:  Patient from [facility]. Per daughter, patient new to state from [place]. Pt unsafe to return back to ALF at this time and will need rehab. Pt only alert to person and therefore daughter is the contact. Family in agreement with recommendations for SNF.   Social Worker assessment / plan:  CSW called daughter and introduced self and explained CSW role in DC  Planning. CSW discussed clinical teams recommendation for SNF options/placement with daughter.  CSW discussed the insurance authorization that will be needed and that SNF will have to initiate auth. CSW discussed the barriers that may be faced.  FL2 pending, offers to be sent out, offers pending.  Employment status:  Retired Database administratornsurance information:  Managed Medicare PT Recommendations:  Skilled Nursing Facility Information / Referral to community resources:  Skilled Nursing Facility  Patient/Family's Response to care:  Family appreciative of CSW  calling to discuss SNF process. No issues or concerns identified at this time.  Patient/Family's Understanding of and Emotional Response to Diagnosis, Current Treatment, and Prognosis:  Family has good understanding of diagnosis, current treatment and prognosis and is hopeful that SNF will assist with impairment. No issues or concerns identified at this time.  Emotional Assessment Appearance:  Appears stated age Attitude/Demeanor/Rapport:  Other Affect (typically observed):  Unable to Assess Orientation:  Oriented to Self Alcohol / Substance use:  Not Applicable Psych involvement (Current and /or in the community):  No (Comment)  Discharge Needs  Concerns to be addressed:  Care Coordination Readmission within the last 30 days:  No Current discharge risk:  Physical Impairment, Dependent with Mobility Barriers to Discharge:  No Barriers Identified   Tresa Mooreatricia V Carmisha Larusso, LCSW 05/23/2017, 12:36 PM

## 2017-05-24 DIAGNOSIS — S72141A Displaced intertrochanteric fracture of right femur, initial encounter for closed fracture: Principal | ICD-10-CM

## 2017-05-24 LAB — BASIC METABOLIC PANEL
ANION GAP: 9 (ref 5–15)
BUN: 9 mg/dL (ref 6–20)
CALCIUM: 8.3 mg/dL — AB (ref 8.9–10.3)
CO2: 23 mmol/L (ref 22–32)
Chloride: 103 mmol/L (ref 101–111)
Creatinine, Ser: 0.67 mg/dL (ref 0.44–1.00)
Glucose, Bld: 96 mg/dL (ref 65–99)
Potassium: 3.9 mmol/L (ref 3.5–5.1)
SODIUM: 135 mmol/L (ref 135–145)

## 2017-05-24 LAB — CBC
HEMATOCRIT: 28.9 % — AB (ref 36.0–46.0)
Hemoglobin: 9.4 g/dL — ABNORMAL LOW (ref 12.0–15.0)
MCH: 29.8 pg (ref 26.0–34.0)
MCHC: 32.5 g/dL (ref 30.0–36.0)
MCV: 91.7 fL (ref 78.0–100.0)
Platelets: 122 10*3/uL — ABNORMAL LOW (ref 150–400)
RBC: 3.15 MIL/uL — ABNORMAL LOW (ref 3.87–5.11)
RDW: 14.6 % (ref 11.5–15.5)
WBC: 5.6 10*3/uL (ref 4.0–10.5)

## 2017-05-24 MED ORDER — MUPIROCIN 2 % EX OINT
TOPICAL_OINTMENT | Freq: Two times a day (BID) | CUTANEOUS | Status: DC
  Administered 2017-05-24: 1 via NASAL
  Administered 2017-05-25: 10:00:00 via NASAL
  Filled 2017-05-24: qty 22

## 2017-05-24 NOTE — Discharge Summary (Addendum)
Physician Discharge Summary  Casidy Alberta ZOX:[id number] DOB: 1931/10/03 DOA: 05/21/2017  PCP: System, Pcp Not In  Admit date: 05/21/2017 Discharge date: 05/24/2017  Time spent: > 35 minutes  Recommendations for Outpatient Follow-up:  1. Please ensure patient follows up with orthopedic surgeon Dr. Turner Daniels   Discharge Diagnoses:  Principal Problem:   Closed intertrochanteric fracture of hip, right, initial encounter [facility]) Active Problems:   Essential hypertension   Seizures (HCC)   Alzheimer's type dementia with late onset without behavioral disturbance   Normocytic anemia   Hypokalemia   Discharge Condition: stable  Diet recommendation: regular diet   History of present illness:  83 y.o. 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 She presented with hip pain after a fall and found to have a right hip fracture.  Hospital Course:  Right hip fracture S/p fall. Orthopedic surgery consulted on admission. S/p ORIF on 05/22/17. -orthopedic surgery f/u as outpatient.  Essential hypertension  -contine hydrochlorothiazide, Coreg  Dementia Patient in a memory care unit as an outpatient -delirium precautions -continue Buspar and sertraline  Anemia Stable. Trended down with IV fluids  History of hemorrhagic stroke Seizures -continue Keppra  Hypokalemia Resolved with supplementation.  Thrombocytopenia Down slightly. No bleeding. -stable. Recommend monitoring as outpatient.  Procedures:  As above  Consultations:  Orthopaedic surgeon  Discharge Exam: Vitals:   05/23/17 1956 05/24/17 0349  BP: (!) 136/47 115/90  Pulse: 76 80  Resp: 18 18  Temp: 98.2 F (36.8 C) 98.2 F (36.8 C)  SpO2: 96% 97%    General: Pt in nad, alert and awake Cardiovascular: rrr, no rubs Respiratory: no increased wob, no wheezes  Discharge Instructions   Discharge Instructions    Call MD for:  severe  uncontrolled pain    Complete by:  As directed    Call MD for:  temperature >100.4    Complete by:  As directed    Diet - low sodium heart healthy    Complete by:  As directed    Increase activity slowly    Complete by:  As directed    Weight bearing as tolerated    Complete by:  As directed      Current Discharge Medication List    START taking these medications   Details  aspirin EC 325 MG tablet Take 1 tablet (325 mg total) by mouth 2 (two) times daily. Qty: 30 tablet, Refills: 0    HYDROcodone-acetaminophen (NORCO) 5-325 MG tablet Take 1 tablet by mouth every 6 (six) hours as needed. Qty: 30 tablet, Refills: 0    tiZANidine (ZANAFLEX) 2 MG tablet Take 1 tablet (2 mg total) by mouth every 6 (six) hours as needed for muscle spasms. Qty: 60 tablet, Refills: 0      CONTINUE these medications which have NOT CHANGED   Details  alum & mag hydroxide-simeth (MAALOX PLUS) 400-400-40 MG/5ML suspension Take 15 mLs by mouth every 6 (six) hours as needed for indigestion.    busPIRone (BUSPAR) 15 MG tablet Take 7.5 mg by mouth 2 (two) times daily.    carvedilol (COREG) 12.5 MG tablet Take 12.5 mg by mouth 2 (two) times daily with a meal.    cholecalciferol (VITAMIN D) 1000 units tablet Take 2,000 Units by mouth daily.    estradiol (ESTRACE) 0.1 MG/GM vaginal cream Place 1 Applicatorful vaginally 2 (two) times a week.    ferrous sulfate 325 (65 FE) MG EC tablet Take 325 mg by mouth  2 (two) times daily.    hydrochlorothiazide (MICROZIDE) 12.5 MG capsule Take 12.5 mg by mouth daily.    levETIRAcetam (KEPPRA) 500 MG tablet Take 500 mg by mouth 2 (two) times daily.    Melatonin 3 MG TABS Take 3 mg by mouth at bedtime.    OVER THE COUNTER MEDICATION Place 1 drop into both ears daily.    phenazopyridine (PYRIDIUM) 100 MG tablet Take 200 mg by mouth 3 (three) times daily as needed for pain.    senna-docusate (SENOKOT-S) 8.6-50 MG tablet Take 1 tablet by mouth daily.    sertraline  (ZOLOFT) 50 MG tablet Take 50 mg by mouth daily.    vitamin B-12 (CYANOCOBALAMIN) 500 MCG tablet Take 1,500 mcg by mouth daily.      STOP taking these medications     acetaminophen (TYLENOL) 500 MG tablet        Allergies  Allergen Reactions  . Latex    Follow-up Information    Gean Birchwoodowan, Frank, MD In 2 weeks.   Specialty:  Orthopedic Surgery Contact information: [address] [place] [id number] [phone]            The results of significant diagnostics from this hospitalization (including imaging, microbiology, ancillary and laboratory) are listed below for reference.    Significant Diagnostic Studies: Pelvis Portable  Result Date: 05/22/2017 CLINICAL DATA:  Status post ORIF for right femur fracture EXAM: PORTABLE PELVIS 1-2 VIEWS COMPARISON:  Intraoperative fluoro spot images of today's date and AP view of the right hip of May 21, 2017. FINDINGS: The bony pelvis is subjectively osteopenic. The patient has undergone intramedullary rod and telescoping nail fixation for a nondisplaced intertrochanteric fracture. Alignment is anatomic. No postprocedure complication is observed. IMPRESSION: No postprocedure complication following ORIF for a nondisplaced intertrochanteric fracture of the right femur. Electronically Signed   By: David  SwazilandJordan M.D.   On: 05/22/2017 15:02   Dg Knee Complete 4 Views Right  Result Date: 05/21/2017 CLINICAL DATA:  Status post fall, with right knee pain. Initial encounter. EXAM: RIGHT KNEE - COMPLETE 4+ VIEW COMPARISON:  None. FINDINGS: There is no evidence of fracture or dislocation. The joint spaces are preserved. No significant degenerative change is seen; the patellofemoral joint is grossly unremarkable in appearance. Chondrocalcinosis is noted. No significant joint effusion is seen. The visualized soft tissues are normal in appearance. IMPRESSION: 1. No evidence of fracture or dislocation. 2. Chondrocalcinosis noted. Electronically Signed   By:  Roanna RaiderJeffery  Chang M.D.   On: 05/21/2017 22:26   Dg C-arm 1-60 Min  Result Date: 05/22/2017 CLINICAL DATA:  Right femur ORIF. EXAM: RIGHT FEMUR 2 VIEWS; DG C-ARM 61-120 MIN COMPARISON:  Right femur x-rays dated May 21, 2017. FLUOROSCOPY TIME:  1 minute, 17 seconds. C-arm fluoroscopic images were obtained intraoperatively and submitted for post operative interpretation. FINDINGS: Intraoperative x-rays demonstrate interval cephalomedullary rod fixation of the right femur. Alignment is normal. IMPRESSION: Right femur ORIF without evidence of hardware complication. Electronically Signed   By: Obie DredgeWilliam T Derry M.D.   On: 05/22/2017 14:30   Dg Hip Unilat With Pelvis 2-3 Views Right  Result Date: 05/21/2017 CLINICAL DATA:  Status post fall, with right hip pain. Initial encounter. EXAM: DG HIP (WITH OR WITHOUT PELVIS) 2-3V RIGHT COMPARISON:  None. FINDINGS: There appears to be a minimally displaced right femoral intertrochanteric fracture. Both femoral heads are seated normally within their respective acetabula. The proximal right femur appears intact. Mild degenerative change is noted at the lower lumbar spine. The sacroiliac joints  are unremarkable in appearance. The visualized bowel gas pattern is grossly unremarkable in appearance. IMPRESSION: Minimally displaced right femoral intertrochanteric fracture suspected. Electronically Signed   By: Roanna Raider M.D.   On: 05/21/2017 22:26   Dg Femur, Min 2 Views Right  Result Date: 05/22/2017 CLINICAL DATA:  Right femur ORIF. EXAM: RIGHT FEMUR 2 VIEWS; DG C-ARM 61-120 MIN COMPARISON:  Right femur x-rays dated May 21, 2017. FLUOROSCOPY TIME:  1 minute, 17 seconds. C-arm fluoroscopic images were obtained intraoperatively and submitted for post operative interpretation. FINDINGS: Intraoperative x-rays demonstrate interval cephalomedullary rod fixation of the right femur. Alignment is normal. IMPRESSION: Right femur ORIF without evidence of hardware complication.  Electronically Signed   By: Obie Dredge M.D.   On: 05/22/2017 14:30    Microbiology: Recent Results (from the past 240 hour(s))  Surgical PCR screen     Status: Abnormal   Collection Time: 05/22/17 10:31 AM  Result Value Ref Range Status   MRSA, PCR POSITIVE (A) NEGATIVE Final    Comment: CRITICAL RESULT CALLED TO, READ BACK BY AND VERIFIED WITH: Holley Dexter, RN AT 1230 ON 05/22/17 BY C. JESSUP, MLT.    Staphylococcus aureus POSITIVE (A) NEGATIVE Final    Comment:        The Xpert SA Assay (FDA approved for NASAL specimens in patients over 81 years of age), is one component of a comprehensive surveillance program.  Test performance has been validated by [facility] for patients greater than or equal to 5 year old. It is not intended to diagnose infection nor to guide or monitor treatment. Performed at [facility], [address] [place], [place] [address]      Labs: Basic Metabolic Panel:  Recent Labs Lab 05/21/17 2242 05/22/17 0419 05/23/17 0321 05/24/17 0521  NA 137 135 132* 135  K 3.2* 3.2* 4.4 3.9  CL 102 102 102 103  CO2 25 26 25 23   GLUCOSE 122* 120* 122* 96  BUN 20 17 12 9   CREATININE 0.85 0.80 0.75 0.67  CALCIUM 9.1 8.7* 8.1* 8.3*  MG  --  1.8  --   --    Liver Function Tests: No results for input(s): AST, ALT, ALKPHOS, BILITOT, PROT, ALBUMIN in the last 168 hours. No results for input(s): LIPASE, AMYLASE in the last 168 hours. No results for input(s): AMMONIA in the last 168 hours. CBC:  Recent Labs Lab 05/21/17 2242 05/22/17 0419 05/23/17 0321 05/24/17 0521  WBC 8.4 6.0 5.4 5.6  NEUTROABS 6.6  --   --   --   HGB 11.3* 10.7* 9.5* 9.4*  HCT 32.8* 31.5* 29.2* 28.9*  MCV 90.9 90.0 92.1 91.7  PLT 161 139* 124* 122*   Cardiac Enzymes:  Recent Labs Lab 05/22/17 0419  TROPONINI <0.03   BNP: BNP (last 3 results) No results for input(s): BNP in the last 8760 hours.  ProBNP (last 3 results) No results for input(s): PROBNP in the  last 8760 hours.  CBG: No results for input(s): GLUCAP in the last 168 hours.     Signed:  Penny Pia MD.  [facility] 05/24/2017, 1:23 PM   Patient stable for discharge.  Penny Pia 05/25/17

## 2017-05-24 NOTE — Progress Notes (Signed)
PATIENT ID: Zella BallHarriett Herbst  MRN: [id number]  DOB/AGE:  10-29-1930 / 83 y.o.  2 Days Post-Op Procedure(s) (LRB): INTRAMEDULLARY (IM) NAIL FEMORAL (Right)    PROGRESS NOTE Subjective: Patient is alert, oriented, no Nausea, no Vomiting, yes passing gas, . Taking PO well. Denies SOB, Chest or Calf Pain. Using Incentive Spirometer, PAS in place. Ambulate WBAT with pt walking 15 ft with therapy Patient reports pain as  0/10  .    Objective: Vital signs in last 24 hours: Vitals:   05/23/17 0408 05/23/17 1500 05/23/17 1956 05/24/17 0349  BP: (!) 142/50 (!) 141/49 (!) 136/47 115/90  Pulse: 73 74 76 80  Resp: 18 18 18 18   Temp: 98.7 F (37.1 C) 98.6 F (37 C) 98.2 F (36.8 C) 98.2 F (36.8 C)  TempSrc: Oral Oral Oral Oral  SpO2: 95% 95% 96% 97%      Intake/Output from previous day: I/O last 3 completed shifts: In: 360 [P.O.:360] Out: 1302 [Urine:1300; Stool:2]   Intake/Output this shift: No intake/output data recorded.   LABORATORY DATA:  Recent Labs  05/23/17 0321 05/24/17 0521  WBC 5.4 5.6  HGB 9.5* 9.4*  HCT 29.2* 28.9*  PLT 124* 122*  NA 132* 135  K 4.4 3.9  CL 102 103  CO2 25 23  BUN 12 9  CREATININE 0.75 0.67  GLUCOSE 122* 96  CALCIUM 8.1* 8.3*    Examination: Neurologically intact Neurovascular intact Sensation intact distally Intact pulses distally Dorsiflexion/Plantar flexion intact Incision: dressing C/D/I and no drainage No cellulitis present Compartment soft} XR AP&Lat of hip shows well placed\fixed THA  Assessment:   2 Days Post-Op Procedure(s) (LRB): INTRAMEDULLARY (IM) NAIL FEMORAL (Right) ADDITIONAL DIAGNOSIS:  Expected Acute Blood Loss Anemia, Hypertension and Moderate Alzheimer's disease, history of anemia, history of seizures  Plan: PT/OT WBAT  DVT Prophylaxis: SCDx72 hrs, ASA 325 mg BID x 2 weeks  DISCHARGE PLAN: Skilled Nursing Facility/Rehab - [facility]  DISCHARGE NEEDS: HHPT, Walker and 3-in-1 comode seat

## 2017-05-25 LAB — CBC
HEMATOCRIT: 27.8 % — AB (ref 36.0–46.0)
Hemoglobin: 9.2 g/dL — ABNORMAL LOW (ref 12.0–15.0)
MCH: 30.4 pg (ref 26.0–34.0)
MCHC: 33.1 g/dL (ref 30.0–36.0)
MCV: 91.7 fL (ref 78.0–100.0)
PLATELETS: 131 10*3/uL — AB (ref 150–400)
RBC: 3.03 MIL/uL — AB (ref 3.87–5.11)
RDW: 14.5 % (ref 11.5–15.5)
WBC: 5.3 10*3/uL (ref 4.0–10.5)

## 2017-05-25 NOTE — Anesthesia Postprocedure Evaluation (Signed)
Anesthesia Post Note  Patient: Lynn Reynolds  Procedure(s) Performed: Procedure(s) (LRB): INTRAMEDULLARY (IM) NAIL FEMORAL (Right)     Patient location during evaluation: PACU Anesthesia Type: General Level of consciousness: awake and alert and patient cooperative Pain management: pain level controlled Vital Signs Assessment: post-procedure vital signs reviewed and stable Respiratory status: spontaneous breathing and respiratory function stable Cardiovascular status: stable Anesthetic complications: no    Last Vitals:  Vitals:   05/24/17 2231 05/25/17 0456  BP: (!) 162/50 (!) 155/71  Pulse:  80  Resp:  16  Temp:  36.6 C  SpO2:  98%    Last Pain:  Vitals:   05/25/17 0456  TempSrc: Oral  PainSc:                  [name]

## 2017-05-25 NOTE — Progress Notes (Signed)
Attempt x 2 to call report to [phone] transferred to nursing station and no answer.  Left message and contact info with operator to return call for report.  AkingRNBSN

## 2017-05-25 NOTE — Social Work (Signed)
Clinical Social Worker facilitated patient discharge including contacting patient family and facility to confirm patient discharge plans.  Clinical information faxed to facility and family agreeable with plan.    CSW arranged ambulance transport via [facility] to [facility] .    RN to call [phone] to give report prior to discharge.  Clinical Social Worker will sign off for now as social work intervention is no longer needed. Please consult us again if new need arises.  Krystyl Cannell, LCSW Clinical Social Worker [phone]    

## 2017-05-25 NOTE — Progress Notes (Signed)
Pt discharged to SNF in stable condition, all discharge instructions reviewed & Rx;s x 3 given to pt's daughter. Pt transported via cart with transporters x 2 at bedside.  AKing RNBSN.

## 2017-05-25 NOTE — Social Work (Signed)
CSW was advised by SNF that they received Insurance Auth for placement.  [facility].  Keene BreathPatricia Adianna Darwin, LCSW Clinical Social Worker [phone]

## 2017-05-25 NOTE — Clinical Social Work Placement (Signed)
   CLINICAL SOCIAL WORK PLACEMENT  NOTE  Date:  05/25/2017  Patient Details  Name: Lynn Reynolds MRN: [id number] Date of Birth: 06-10-1931  Clinical Social Work is seeking post-discharge placement for this patient at the Skilled  Nursing Facility level of care (*CSW will initial, date and re-position this form in  chart as items are completed):  Yes   Patient/family provided with [place] Clinical Social Work Department's list of facilities offering this level of care within the geographic area requested by the patient (or if unable, by the patient's family).  Yes   Patient/family informed of their freedom to choose among providers that offer the needed level of care, that participate in Medicare, Medicaid or managed care program needed by the patient, have an available bed and are willing to accept the patient.  Yes   Patient/family informed of 's ownership interest in [facility] and [facility], as well as of the fact that they are under no obligation to receive care at these facilities.  PASRR submitted to EDS on       PASRR number received on 05/23/17     Existing PASRR number confirmed on       FL2 transmitted to all facilities in geographic area requested by pt/family on 05/23/17     FL2 transmitted to all facilities within larger geographic area on       Patient informed that his/her managed care company has contracts with or will negotiate with certain facilities, including the following:        Yes   Patient/family informed of bed offers received.  [facility]     Physician recommends and patient chooses bed at      [facility]  Patient to be transferred to facility by [facility]     Patient family notified on 05/25/17 of transfer.  Name of family member notified:  Daughter Leafy Halfam Powell notified of transport     PHYSICIAN Please prepare priority discharge summary, including  medications, Please prepare prescriptions, Please sign FL2     Additional Comment:    _______________________________________________ Tresa MoorePatricia V Emmersyn Kratzke, LCSW 05/25/2017, 9:34 AM

## 2018-03-10 ENCOUNTER — Other Ambulatory Visit: Payer: Self-pay

## 2018-03-10 ENCOUNTER — Emergency Department (HOSPITAL_COMMUNITY)
Admission: EM | Admit: 2018-03-10 | Discharge: 2018-03-10 | Disposition: A | Discharge status: Home / Self Care | Payer: Medicare HMO | Attending: Emergency Medicine | Admitting: Emergency Medicine

## 2018-03-10 ENCOUNTER — Encounter (HOSPITAL_COMMUNITY): Payer: Self-pay | Admitting: Emergency Medicine

## 2018-03-10 ENCOUNTER — Emergency Department (HOSPITAL_COMMUNITY): Payer: Medicare HMO

## 2018-03-10 DIAGNOSIS — S01112A Laceration without foreign body of left eyelid and periocular area, initial encounter: Secondary | ICD-10-CM | POA: Diagnosis not present

## 2018-03-10 DIAGNOSIS — I10 Essential (primary) hypertension: Secondary | ICD-10-CM | POA: Insufficient documentation

## 2018-03-10 DIAGNOSIS — Z91040 Latex allergy status: Secondary | ICD-10-CM | POA: Diagnosis not present

## 2018-03-10 DIAGNOSIS — F0390 Unspecified dementia without behavioral disturbance: Secondary | ICD-10-CM | POA: Insufficient documentation

## 2018-03-10 DIAGNOSIS — Z7982 Long term (current) use of aspirin: Secondary | ICD-10-CM | POA: Diagnosis not present

## 2018-03-10 DIAGNOSIS — W19XXXA Unspecified fall, initial encounter: Secondary | ICD-10-CM | POA: Insufficient documentation

## 2018-03-10 DIAGNOSIS — S0083XA Contusion of other part of head, initial encounter: Secondary | ICD-10-CM

## 2018-03-10 DIAGNOSIS — Y9210 Unspecified residential institution as the place of occurrence of the external cause: Secondary | ICD-10-CM | POA: Insufficient documentation

## 2018-03-10 DIAGNOSIS — S0181XA Laceration without foreign body of other part of head, initial encounter: Secondary | ICD-10-CM

## 2018-03-10 DIAGNOSIS — S0990XA Unspecified injury of head, initial encounter: Secondary | ICD-10-CM | POA: Diagnosis present

## 2018-03-10 DIAGNOSIS — S0093XA Contusion of unspecified part of head, initial encounter: Secondary | ICD-10-CM | POA: Insufficient documentation

## 2018-03-10 DIAGNOSIS — Y939 Activity, unspecified: Secondary | ICD-10-CM | POA: Diagnosis not present

## 2018-03-10 DIAGNOSIS — Z79899 Other long term (current) drug therapy: Secondary | ICD-10-CM | POA: Insufficient documentation

## 2018-03-10 DIAGNOSIS — Y998 Other external cause status: Secondary | ICD-10-CM | POA: Diagnosis not present

## 2018-03-10 LAB — CBC WITH DIFFERENTIAL/PLATELET
ABS IMMATURE GRANULOCYTES: 0 10*3/uL (ref 0.0–0.1)
Basophils Absolute: 0 10*3/uL (ref 0.0–0.1)
Basophils Relative: 0 %
Eosinophils Absolute: 0.1 10*3/uL (ref 0.0–0.7)
Eosinophils Relative: 1 %
HEMATOCRIT: 37 % (ref 36.0–46.0)
Hemoglobin: 11.8 g/dL — ABNORMAL LOW (ref 12.0–15.0)
IMMATURE GRANULOCYTES: 0 %
LYMPHS ABS: 0.6 10*3/uL — AB (ref 0.7–4.0)
Lymphocytes Relative: 8 %
MCH: 29.9 pg (ref 26.0–34.0)
MCHC: 31.9 g/dL (ref 30.0–36.0)
MCV: 93.9 fL (ref 78.0–100.0)
MONOS PCT: 6 %
Monocytes Absolute: 0.4 10*3/uL (ref 0.1–1.0)
NEUTROS ABS: 5.9 10*3/uL (ref 1.7–7.7)
NEUTROS PCT: 85 %
PLATELETS: 138 10*3/uL — AB (ref 150–400)
RBC: 3.94 MIL/uL (ref 3.87–5.11)
RDW: 13.8 % (ref 11.5–15.5)
WBC: 7 10*3/uL (ref 4.0–10.5)

## 2018-03-10 LAB — BASIC METABOLIC PANEL
ANION GAP: 9 (ref 5–15)
BUN: 10 mg/dL (ref 6–20)
CHLORIDE: 103 mmol/L (ref 101–111)
CO2: 27 mmol/L (ref 22–32)
Calcium: 8.9 mg/dL (ref 8.9–10.3)
Creatinine, Ser: 0.83 mg/dL (ref 0.44–1.00)
GFR calc non Af Amer: >60 mL/min (ref >60)
Glucose, Bld: 118 mg/dL — ABNORMAL HIGH (ref 65–99)
Potassium: 3.4 mmol/L — ABNORMAL LOW (ref 3.5–5.1)
Sodium: 139 mmol/L (ref 135–145)

## 2018-03-10 LAB — I-STAT TROPONIN, ED: Troponin i, poc: 0 ng/mL (ref 0.00–0.08)

## 2018-03-10 MED ORDER — ONDANSETRON HCL 4 MG/2ML IJ SOLN
4.0000 mg | Freq: Once | INTRAMUSCULAR | Status: AC
  Administered 2018-03-10: 4 mg via INTRAVENOUS

## 2018-03-10 MED ORDER — ACETAMINOPHEN 500 MG PO TABS
1000.0000 mg | ORAL_TABLET | Freq: Once | ORAL | Status: DC
  Filled 2018-03-10: qty 2

## 2018-03-10 MED ORDER — LIDOCAINE-EPINEPHRINE (PF) 2 %-1:200000 IJ SOLN
10.0000 mL | Freq: Once | INTRAMUSCULAR | Status: AC
  Administered 2018-03-10: 10 mL
  Filled 2018-03-10: qty 20

## 2018-03-10 MED ORDER — LORAZEPAM 2 MG/ML IJ SOLN
0.5000 mg | Freq: Once | INTRAMUSCULAR | Status: AC
  Administered 2018-03-10: 0.5 mg via INTRAVENOUS
  Filled 2018-03-10: qty 1

## 2018-03-10 MED ORDER — ONDANSETRON HCL 4 MG/2ML IJ SOLN
4.0000 mg | Freq: Once | INTRAMUSCULAR | Status: DC
  Filled 2018-03-10: qty 2

## 2018-03-10 NOTE — ED Notes (Signed)
This RN spoke with Malachi ProMary Nance at [facility] who spoke with Renold GentaMichael Mabe [facility](Coordinator) who st's they will except pt back and they will give pt her b/p med when she returns and monitor her blood pressure.

## 2018-03-10 NOTE — ED Provider Notes (Signed)
[facility] EMERGENCY DEPARTMENT Provider Note   CSN: [id number] Arrival date & time: 03/10/18  1037     History   Chief Complaint Chief Complaint  Patient presents with  . Fall  . Head Injury    HPI Lynn Reynolds is a 84 y.o. female.  Pt presents to the ED today with fall.  The pt has a hx of dementia and lives in a memory unit.  The fall was unwitnessed and she is unable to give any hx.  The pt did hit her face.  ?loc.  No blood thinners.  No other injuries.     Past Medical History:  Diagnosis Date  . Anxiety   . CVA (cerebral vascular accident) (HCC) 11/10/2016   daughter reports "made the dementia worse" (05/23/2017)  . Dementia    "moderate" (05/23/2017)  . GERD (gastroesophageal reflux disease)   . Hypertension   . Seizures (HCC) ~ 03/2017   "as a result of the stroker" (05/23/2017)    Patient Active Problem List   Diagnosis Date Noted  . Essential hypertension 05/22/2017  . Seizures (HCC) 05/22/2017  . Alzheimer's type dementia with late onset without behavioral disturbance 05/22/2017  . Normocytic anemia 05/22/2017  . Hypokalemia 05/22/2017  . Closed intertrochanteric fracture of hip, right, initial encounter (HCC) 05/21/2017    Past Surgical History:  Procedure Laterality Date  . APPENDECTOMY    . BREAST CYST EXCISION    . FEMUR IM NAIL Right 05/22/2017  . FEMUR IM NAIL Right 05/22/2017   Procedure: INTRAMEDULLARY (IM) NAIL FEMORAL;  Surgeon: Gean Birchwoodowan, Frank, MD;  Location: [facility] OR;  Service: Orthopedics;  Laterality: Right;  . FRACTURE SURGERY    . TONSILLECTOMY       OB History   None      Home Medications    Prior to Admission medications   Medication Sig Start Date End Date Taking? Authorizing Provider  acetaminophen (TYLENOL) 500 MG tablet Take 500 mg by mouth every 4 (four) hours as needed for mild pain.   Yes [provider]  ALPRAZolam Prudy Feeler(XANAX) 0.5 MG tablet Take 0.25-0.5 mg by mouth See admin instructions. Take  0.25 mg in the morning and 0.5 mg at bedtime   Yes [provider]  ALPRAZolam (XANAX) 0.5 MG tablet Take 0.5 mg by mouth every 4 (four) hours as needed for anxiety.   Yes [provider]  alum & mag hydroxide-simeth (MAALOX PLUS) 400-400-40 MG/5ML suspension Take 15 mLs by mouth every 6 (six) hours as needed for indigestion.   Yes [provider]  carvedilol (COREG) 12.5 MG tablet Take 12.5 mg by mouth 2 (two) times daily with a meal.   Yes [provider]  cholecalciferol (VITAMIN D) 1000 units tablet Take 2,000 Units by mouth daily.   Yes [provider]  Cranberry 425 MG CAPS Take 425 mg by mouth 2 (two) times daily.   Yes [provider]  levETIRAcetam (KEPPRA) 100 MG/ML solution Take 750 mg by mouth 2 (two) times daily. 7.5 ml   Yes [provider]  Melatonin 3 MG TABS Take 3 mg by mouth at bedtime.   Yes [provider]  nitrofurantoin (MACRODANTIN) 100 MG capsule Take 100 mg by mouth 2 (two) times daily.   Yes [provider]  OVER THE COUNTER MEDICATION Place 2 drops into both ears once a week. Heavy Mineral oil   Yes [provider]  phenazopyridine (PYRIDIUM) 100 MG tablet Take 200 mg by mouth every  8 (eight) hours as needed (Dysuria).    Yes [provider]  ranitidine (RANITIDINE 150 MAX STRENGTH) 150 MG tablet Take 150 mg by mouth at bedtime.   Yes [provider]  senna-docusate (SENOKOT-S) 8.6-50 MG tablet Take 1 tablet by mouth daily.   Yes [provider]  sertraline (ZOLOFT) 100 MG tablet Take 150 mg by mouth daily.    Yes [provider]  vitamin B-12 (CYANOCOBALAMIN) 500 MCG tablet Take 500 mcg by mouth daily.    Yes [provider]  aspirin EC 325 MG tablet Take 1 tablet (325 mg total) by mouth 2 (two) times daily. 05/22/17   Allena Katz, PA-C  HYDROcodone-acetaminophen (NORCO) 5-325 MG tablet Take 1 tablet by mouth every 6 (six) hours as  needed. Patient not taking: Reported on 03/10/2018 05/22/17   Allena Katz, PA-C  tiZANidine (ZANAFLEX) 2 MG tablet Take 1 tablet (2 mg total) by mouth every 6 (six) hours as needed for muscle spasms. Patient not taking: Reported on 03/10/2018 05/22/17   Allena Katz, PA-C    Family History Family History  Problem Relation Age of Onset  . Diabetes Mother     Social History Social History   Tobacco Use  . Smoking status: Never Smoker  . Smokeless tobacco: Never Used  Substance Use Topics  . Alcohol use: No  . Drug use: No     Allergies   Lactose intolerance (gi); Latex; Paxil [paroxetine hcl]; and Pollen extract   Review of Systems Review of Systems  HENT: Positive for facial swelling.   Skin: Positive for wound.  All other systems reviewed and are negative.    Physical Exam Updated Vital Signs BP (!) 163/107   Pulse 79   Temp 98.6 F (37 C) (Oral)   Resp (!) 21   Wt 68 kg (150 lb)   SpO2 96%   Physical Exam  Constitutional: She appears well-developed and well-nourished.  HENT:  Head:    Right Ear: External ear normal.  Left Ear: External ear normal.  Nose: Nose normal.  Mouth/Throat: Oropharynx is clear and moist.  Eyes: Pupils are equal, round, and reactive to light. Conjunctivae and EOM are normal.  Neck: Normal range of motion. Neck supple.  Cardiovascular: Normal rate, regular rhythm, normal heart sounds and intact distal pulses.  Pulmonary/Chest: Effort normal and breath sounds normal.  Abdominal: Soft. Bowel sounds are normal.  Musculoskeletal: Normal range of motion.  Neurological: She is alert.  Pt is oriented to person only (chronic).  She is moving all 4 extremities.  Psychiatric: She has a normal mood and affect.  Nursing note and vitals reviewed.    ED Treatments / Results  Labs (all labs ordered are listed, but only abnormal results are displayed) Labs Reviewed  BASIC METABOLIC PANEL - Abnormal; Notable for the following  components:      Result Value   Potassium 3.4 (*)    Glucose, Bld 118 (*)    All other components within normal limits  CBC WITH DIFFERENTIAL/PLATELET - Abnormal; Notable for the following components:   Hemoglobin 11.8 (*)    Platelets 138 (*)    Lymphs Abs 0.6 (*)    All other components within normal limits  URINE CULTURE  URINALYSIS, ROUTINE W REFLEX MICROSCOPIC  I-STAT TROPONIN, ED    EKG EKG Interpretation  Date/Time:  Saturday March 10 2018 10:43:44 EDT Ventricular Rate:  68 PR Interval:    QRS Duration: 92 QT Interval:  439 QTC  Calculation: 467 R Axis:   7 Text Interpretation:  Sinus rhythm Borderline low voltage, extremity leads Confirmed by Jacalyn Lefevre [phone]) on 03/10/2018 10:48:45 AM   Radiology Dg Chest 2 View  Result Date: 03/10/2018 CLINICAL DATA:  Pt in from [facility] via [place] after unwitnessed fall. L temporal hematoma present, L eye lac. No thinner history, does have hx of ICH, HTN and dementia. EXAM: CHEST - 2 VIEW COMPARISON:  None. FINDINGS: Cardiac silhouette is mildly enlarged. No mediastinal or hilar masses. No evidence of adenopathy. Lungs are hyperexpanded with prominent bronchovascular markings. No evidence of pneumonia or pulmonary edema. No pleural effusion or pneumothorax. Skeletal structures are demineralized but intact. IMPRESSION: No acute cardiopulmonary disease. Electronically Signed   By: Amie Portland M.D.   On: 03/10/2018 11:11   Dg Pelvis 1-2 Views  Result Date: 03/10/2018 CLINICAL DATA:  Pt in from [facility] via [place] after unwitnessed fall. L temporal hematoma present, L eye lac. No thinner history, does have hx of ICH, HTN and dementia. EXAM: PELVIS - 1-2 VIEW COMPARISON:  05/22/2017 FINDINGS: No acute fracture. Status post right proximal femur fracture ORIF. Orthopedic hardware is well-seated, unchanged from the prior study. Hip joints, SI joints and symphysis pubis are normally spaced and aligned. Bones are diffusely demineralized.  Soft tissues are unremarkable. IMPRESSION: No fracture or dislocation.  No acute finding. Electronically Signed   By: Amie Portland M.D.   On: 03/10/2018 11:12   Ct Head Wo Contrast  Result Date: 03/10/2018 CLINICAL DATA:  84 year old female status post unwitnessed fall earlier this morning. Left periorbital laceration and hematoma. EXAM: CT HEAD WITHOUT CONTRAST CT MAXILLOFACIAL WITHOUT CONTRAST CT CERVICAL SPINE WITHOUT CONTRAST TECHNIQUE: Multidetector CT imaging of the head, cervical spine, and maxillofacial structures were performed using the standard protocol without intravenous contrast. Multiplanar CT image reconstructions of the cervical spine and maxillofacial structures were also generated. COMPARISON:  None. FINDINGS: CT HEAD FINDINGS Brain: No evidence of acute infarction, hemorrhage, hydrocephalus, extra-axial collection or mass lesion/mass effect. Cerebral and cerebellar cortical atrophy. Moderate periventricular white matter hypoattenuation most consistent with sequelae of chronic microvascular ischemic white matter disease. Small circumscribed hypoattenuating focus in the right caudate head consistent with remote lacunar infarct. Vascular: No hyperdense vessel or unexpected calcification. Skull: Normal. Negative for fracture or focal lesion. Other: Large left frontal scalp hematoma and left periorbital soft tissue swelling. CT MAXILLOFACIAL FINDINGS Osseous: No fracture or mandibular dislocation. No destructive process. Orbits: No intraorbital hematoma or mass. Surgical changes of prior right lens extraction. Sinuses: Clear. Soft tissues: Large left frontal scalp hematoma. Additionally, soft tissue contusion is present along the lateral rim of the orbit extending inferiorly over the left maxillary antrum. CT CERVICAL SPINE FINDINGS Alignment: No acute malalignment. There is mild levoconvex curvature of the cervical spine which may be secondary to partial osseous fusion of C6 and C7 on the right.  Skull base and vertebrae: No evidence of acute fracture or malalignment. Multilevel degenerative disc disease. Degenerative changes are most severe at C3-C4 and C4-C5. Partial ankylosis of C6 and C7 on the right. In particular, the posterior elements are fused. This results in significant right-sided facet arthropathy at C4-C5 and C7-T1. Soft tissues and spinal canal: No prevertebral fluid or swelling. No visible canal hematoma. Upper chest: Right apical pleuroparenchymal scarring with mild pleural calcifications. Minimal left apical pleuroparenchymal scarring. Otherwise, the upper lungs and chest are unremarkable. Other: None. IMPRESSION: CT HEAD 1. No acute intracranial abnormality. 2. Large left frontotemporal scalp hematoma without evidence  of underlying calvarial fracture. 3. Cerebral cortical atrophy and moderate chronic microvascular ischemic white matter disease. 4. Remote right caudate head lacunar infarct. CT FACE 1. No evidence of acute facial fracture. 2. Large left frontotemporal scalp hematoma and hematoma along the lateral rim of the left orbit extending inferiorly over the maxillary antrum. CT CSPINE 1. No evidence of acute fracture or malalignment. 2. 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 This results in mild levoconvex scoliosis of the cervical spine and advanced multilevel degenerative disc disease most severe at C3-C4 and C4-C5 and right-sided facet arthropathy most severe at C4-C5 and C7-T1. 3. Biapical pleuroparenchymal scarring worse on the right than the left. Electronically Signed   By: Malachy Moan M.D.   On: 03/10/2018 11:48   Ct Cervical Spine Wo Contrast  Result Date: 03/10/2018 CLINICAL DATA:  84 year old female status post unwitnessed fall earlier this morning. Left periorbital laceration and hematoma. EXAM: CT HEAD WITHOUT CONTRAST CT MAXILLOFACIAL WITHOUT CONTRAST CT CERVICAL SPINE WITHOUT  CONTRAST TECHNIQUE: Multidetector CT imaging of the head, cervical spine, and maxillofacial structures were performed using the standard protocol without intravenous contrast. Multiplanar CT image reconstructions of the cervical spine and maxillofacial structures were also generated. COMPARISON:  None. FINDINGS: CT HEAD FINDINGS Brain: No evidence of acute infarction, hemorrhage, hydrocephalus, extra-axial collection or mass lesion/mass effect. Cerebral and cerebellar cortical atrophy. Moderate periventricular white matter hypoattenuation most consistent with sequelae of chronic microvascular ischemic white matter disease. Small circumscribed hypoattenuating focus in the right caudate head consistent with remote lacunar infarct. Vascular: No hyperdense vessel or unexpected calcification. Skull: Normal. Negative for fracture or focal lesion. Other: Large left frontal scalp hematoma and left periorbital soft tissue swelling. CT MAXILLOFACIAL FINDINGS Osseous: No fracture or mandibular dislocation. No destructive process. Orbits: No intraorbital hematoma or mass. Surgical changes of prior right lens extraction. Sinuses: Clear. Soft tissues: Large left frontal scalp hematoma. Additionally, soft tissue contusion is present along the lateral rim of the orbit extending inferiorly over the left maxillary antrum. CT CERVICAL SPINE FINDINGS Alignment: No acute malalignment. There is mild levoconvex curvature of the cervical spine which may be secondary to partial osseous fusion of C6 and C7 on the right. Skull base and vertebrae: No evidence of acute fracture or malalignment. Multilevel degenerative disc disease. Degenerative changes are most severe at C3-C4 and C4-C5. Partial ankylosis of C6 and C7 on the right. In particular, the posterior elements are fused. This results in significant right-sided facet arthropathy at C4-C5 and C7-T1. Soft tissues and spinal canal: No prevertebral fluid or swelling. No visible canal  hematoma. Upper chest: Right apical pleuroparenchymal scarring with mild pleural calcifications. Minimal left apical pleuroparenchymal scarring. Otherwise, the upper lungs and chest are unremarkable. Other: None. IMPRESSION: CT HEAD 1. No acute intracranial abnormality. 2. Large left frontotemporal scalp hematoma without evidence of underlying calvarial fracture. 3. Cerebral cortical atrophy and moderate chronic microvascular ischemic white matter disease. 4. Remote right caudate head lacunar infarct. CT FACE 1. No evidence of acute facial fracture. 2. Large left frontotemporal scalp hematoma and hematoma along the lateral rim of the left orbit extending inferiorly over the maxillary antrum. CT CSPINE 1. No evidence of acute fracture or malalignment. 2. 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 This results in mild levoconvex scoliosis of the cervical spine and advanced multilevel degenerative disc disease most severe at  C3-C4 and C4-C5 and right-sided facet arthropathy most severe at C4-C5 and C7-T1. 3. Biapical pleuroparenchymal scarring worse on the right than the left. Electronically Signed   By: Malachy Moan M.D.   On: 03/10/2018 11:48   Ct Maxillofacial Wo Contrast  Result Date: 03/10/2018 CLINICAL DATA:  84 year old female status post unwitnessed fall earlier this morning. Left periorbital laceration and hematoma. EXAM: CT HEAD WITHOUT CONTRAST CT MAXILLOFACIAL WITHOUT CONTRAST CT CERVICAL SPINE WITHOUT CONTRAST TECHNIQUE: Multidetector CT imaging of the head, cervical spine, and maxillofacial structures were performed using the standard protocol without intravenous contrast. Multiplanar CT image reconstructions of the cervical spine and maxillofacial structures were also generated. COMPARISON:  None. FINDINGS: CT HEAD FINDINGS Brain: No evidence of acute infarction, hemorrhage, hydrocephalus, extra-axial collection or mass  lesion/mass effect. Cerebral and cerebellar cortical atrophy. Moderate periventricular white matter hypoattenuation most consistent with sequelae of chronic microvascular ischemic white matter disease. Small circumscribed hypoattenuating focus in the right caudate head consistent with remote lacunar infarct. Vascular: No hyperdense vessel or unexpected calcification. Skull: Normal. Negative for fracture or focal lesion. Other: Large left frontal scalp hematoma and left periorbital soft tissue swelling. CT MAXILLOFACIAL FINDINGS Osseous: No fracture or mandibular dislocation. No destructive process. Orbits: No intraorbital hematoma or mass. Surgical changes of prior right lens extraction. Sinuses: Clear. Soft tissues: Large left frontal scalp hematoma. Additionally, soft tissue contusion is present along the lateral rim of the orbit extending inferiorly over the left maxillary antrum. CT CERVICAL SPINE FINDINGS Alignment: No acute malalignment. There is mild levoconvex curvature of the cervical spine which may be secondary to partial osseous fusion of C6 and C7 on the right. Skull base and vertebrae: No evidence of acute fracture or malalignment. Multilevel degenerative disc disease. Degenerative changes are most severe at C3-C4 and C4-C5. Partial ankylosis of C6 and C7 on the right. In particular, the posterior elements are fused. This results in significant right-sided facet arthropathy at C4-C5 and C7-T1. Soft tissues and spinal canal: No prevertebral fluid or swelling. No visible canal hematoma. Upper chest: Right apical pleuroparenchymal scarring with mild pleural calcifications. Minimal left apical pleuroparenchymal scarring. Otherwise, the upper lungs and chest are unremarkable. Other: None. IMPRESSION: CT HEAD 1. No acute intracranial abnormality. 2. Large left frontotemporal scalp hematoma without evidence of underlying calvarial fracture. 3. Cerebral cortical atrophy and moderate chronic microvascular  ischemic white matter disease. 4. Remote right caudate head lacunar infarct. CT FACE 1. No evidence of acute facial fracture. 2. Large left frontotemporal scalp hematoma and hematoma along the lateral rim of the left orbit extending inferiorly over the maxillary antrum. CT CSPINE 1. No evidence of acute fracture or malalignment. 2. 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 This results in mild levoconvex scoliosis of the cervical spine and advanced multilevel degenerative disc disease most severe at C3-C4 and C4-C5 and right-sided facet arthropathy most severe at C4-C5 and C7-T1. 3. Biapical pleuroparenchymal scarring worse on the right than the left. Electronically Signed   By: Malachy Moan M.D.   On: 03/10/2018 11:48    Procedures .Marland KitchenLaceration Repair Date/Time: 03/10/2018 12:34 PM Performed by: Jacalyn Lefevre, MD Authorized by: Jacalyn Lefevre, MD   Consent:    Consent obtained:  Emergent situation   Alternatives discussed:  No treatment Anesthesia (see MAR for exact dosages):    Anesthesia method:  Local infiltration   Local anesthetic:  Lidocaine 2% WITH epi Laceration details:    Location:  Face   Face location:  L lower eyelid   Length (cm):  1 Repair type:    Repair type:  Simple Pre-procedure details:    Preparation:  Patient was prepped and draped in usual sterile fashion Exploration:    Contaminated: no   Treatment:    Area cleansed with:  Betadine   Amount of cleaning:  Standard   Irrigation solution:  Sterile saline Skin repair:    Repair method:  Sutures   Suture size:  5-0   Suture material:  Nylon   Suture technique:  Simple interrupted   Number of sutures:  3 Approximation:    Approximation:  Close Post-procedure details:    Patient tolerance of procedure:  Tolerated well, no immediate complications .Marland KitchenLaceration Repair Date/Time: 03/10/2018 12:36 PM Performed by: Jacalyn Lefevre,  MD Authorized by: Jacalyn Lefevre, MD   Consent:    Consent obtained:  Emergent situation Anesthesia (see MAR for exact dosages):    Anesthesia method:  Local infiltration   Local anesthetic:  Lidocaine 2% WITH epi Laceration details:    Location:  Face   Face location:  Forehead   Length (cm):  0.5 Repair type:    Repair type:  Simple Pre-procedure details:    Preparation:  Patient was prepped and draped in usual sterile fashion Exploration:    Hemostasis achieved with:  Epinephrine Treatment:    Area cleansed with:  Saline   Amount of cleaning:  Standard   Irrigation solution:  Sterile saline Skin repair:    Repair method:  Tissue adhesive Post-procedure details:    Dressing:  Open (no dressing)   Patient tolerance of procedure:  Tolerated well, no immediate complications   (including critical care time)  Medications Ordered in ED Medications  ondansetron (ZOFRAN) injection 4 mg (4 mg Intravenous Not Given 03/10/18 1242)  lidocaine-EPINEPHrine (XYLOCAINE W/EPI) 2 %-1:200000 (PF) injection 10 mL (10 mLs Infiltration Given 03/10/18 1143)  ondansetron (ZOFRAN) injection 4 mg (4 mg Intravenous Given 03/10/18 1143)  LORazepam (ATIVAN) injection 0.5 mg (0.5 mg Intravenous Given 03/10/18 1143)     Initial Impression / Assessment and Plan / ED Course  I have reviewed the triage vital signs and the nursing notes.  Pertinent labs & imaging results that were available during my care of the patient were reviewed by me and considered in my medical decision making (see chart for details).   The laceration to the left eyelid involves a little of the lateral canthus.  Wound approximated as well as possible with the swelling that is around the eye.  If she has trouble with eyelid closure, she will need to f/u with ophthalmology (Dr. Sherryll Burger).   Final Clinical Impressions(s) / ED Diagnoses   Final diagnoses:  Contusion of face, initial encounter  Facial laceration, initial encounter    ED  Discharge Orders    None       Jacalyn Lefevre, MD 03/10/18 1534

## 2018-03-10 NOTE — ED Notes (Signed)
[facility] called to transport pt back to [facility]

## 2018-03-10 NOTE — ED Notes (Signed)
Patient refused Tylenol crushed and placed in applesauce-Monique,RN

## 2018-03-10 NOTE — ED Triage Notes (Signed)
Pt in from [facility] after unwitnessed fall. L temporal hematoma present, L eye lac. No thinner history, does have hx of ICH, HTN and dementia. BP 180/110, a&ox2 baseline per staff. B knee abrasions noted as well, denies neck/back tenderness

## 2018-03-10 NOTE — ED Notes (Signed)
Report given to [facility] RN

## 2018-03-10 NOTE — ED Notes (Signed)
Got patient on the monitor did ekg shown to Dr Haviland patient is resting with call bell in reach  ?

## 2018-03-10 NOTE — ED Notes (Signed)
[facility] called to transport pt back 

## 2018-05-18 ENCOUNTER — Non-Acute Institutional Stay: Payer: Medicare HMO | Admitting: Nurse Practitioner

## 2018-05-18 DIAGNOSIS — Z515 Encounter for palliative care: Secondary | ICD-10-CM

## 2018-05-18 DIAGNOSIS — R630 Anorexia: Secondary | ICD-10-CM

## 2018-05-18 DIAGNOSIS — G479 Sleep disorder, unspecified: Secondary | ICD-10-CM

## 2018-05-18 DIAGNOSIS — R269 Unspecified abnormalities of gait and mobility: Secondary | ICD-10-CM

## 2018-05-18 DIAGNOSIS — F0280 Dementia in other diseases classified elsewhere without behavioral disturbance: Secondary | ICD-10-CM

## 2018-05-18 DIAGNOSIS — G309 Alzheimer's disease, unspecified: Secondary | ICD-10-CM

## 2018-05-21 NOTE — Progress Notes (Signed)
PALLIATIVE CARE CONSULT VISIT   PATIENT NAME: Lynn BallHarriett Reynolds DOB: 1931/07/17 MRN: [id number]  PRIMARY CARE PROVIDER:   System, Pcp Not In  REFERRING PROVIDER:  No referring provider defined for this encounter.  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 More than 50% of the time in this consultation was spent coordinating communication.   HISTORY OF PRESENT ILLNESS:  Lynn Reynolds is a 84 y.o. year old female with multiple medical problems including alzheimer's dementia, gait disturbance, fall with fractured right hip, FTT. Palliative Care was asked to help address goals of care.   CODE STATUS: full  PPS: 50% HOSPICE ELIGIBILITY/DIAGNOSIS: TBD  PAST MEDICAL HISTORY:  Past Medical History:  Diagnosis Date  . Anxiety   . CVA (cerebral vascular accident) (HCC) 11/10/2016   daughter reports "made the dementia worse" (05/23/2017)  . Dementia    "moderate" (05/23/2017)  . GERD (gastroesophageal reflux disease)   . Hypertension   . Seizures (HCC) ~ 03/2017   "as a result of the stroker" (05/23/2017)    SOCIAL HX:  Social History   Tobacco Use  . Smoking status: Never Smoker  . Smokeless tobacco: Never Used  Substance Use Topics  . Alcohol use: No    ALLERGIES:  Allergies  Allergen Reactions  . Lactose Intolerance (Gi)     unknown    . Latex     unknown  . Paxil [Paroxetine Hcl]     unknown  . Pollen Extract     unknown     PERTINENT MEDICATIONS:  Outpatient Encounter Medications as of 05/18/2018  Medication Sig  . acetaminophen (TYLENOL) 500 MG tablet Take 500 mg by mouth every 4 (four) hours as needed for mild pain.  Marland Kitchen. ALPRAZolam (XANAX) 0.5 MG tablet Take 0.25-0.5 mg by mouth See admin instructions. Take 0.25 mg in the morning and 0.5 mg at bedtime  . ALPRAZolam (XANAX) 0.5 MG tablet Take 0.5 mg by mouth every 4 (four) hours as needed for anxiety.  Marland Kitchen. alum & mag hydroxide-simeth (MAALOX PLUS) 400-400-40 MG/5ML suspension Take 15 mLs by mouth every 6 (six) hours as needed for indigestion.  Marland Kitchen. aspirin EC 325 MG tablet Take 1 tablet (325 mg total) by mouth 2 (two) times daily.  . carvedilol (COREG) 12.5 MG tablet Take 12.5 mg by mouth 2 (two) times daily with a meal.  . cholecalciferol (VITAMIN D) 1000 units tablet Take 2,000 Units by mouth daily.  . Cranberry 425 MG CAPS Take 425 mg by mouth 2 (two) times daily.  Marland Kitchen. HYDROcodone-acetaminophen (NORCO) 5-325 MG tablet Take 1 tablet by mouth every 6 (six) hours as needed. (Patient not taking: Reported on 03/10/2018)  . levETIRAcetam (KEPPRA) 100 MG/ML solution Take 750 mg by mouth 2 (two) times daily. 7.5 ml  . Melatonin 3 MG TABS Take 3 mg by mouth at bedtime.  . nitrofurantoin (MACRODANTIN) 100 MG capsule Take 100 mg by mouth 2 (two) times daily.  .Marland Kitchen  OVER THE COUNTER MEDICATION Place 2 drops into both ears once a week. Heavy Mineral oil  . phenazopyridine (PYRIDIUM) 100 MG tablet Take 200 mg by mouth every 8 (eight) hours as needed (Dysuria).   . ranitidine (RANITIDINE 150 MAX STRENGTH) 150 MG tablet Take 150 mg by mouth at bedtime.  . senna-docusate (SENOKOT-S) 8.6-50 MG tablet Take 1 tablet by mouth daily.  . sertraline (ZOLOFT) 100 MG tablet Take 150 mg by mouth daily.   Marland Kitchen. tiZANidine (ZANAFLEX) 2 MG tablet Take 1 tablet (2 mg total) by mouth every 6 (six) hours as needed  for muscle spasms. (Patient not taking: Reported on 03/10/2018)  . vitamin B-12 (CYANOCOBALAMIN) 500 MCG tablet Take 500 mcg by mouth daily.    No facility-administered encounter medications on file as of 05/18/2018.     PHYSICAL EXAM:   General: NAD, frail appearing, thin Cardiovascular: regular rate and rhythm Pulmonary: clear ant fields Abdomen: soft, nontender, + bowel sounds GU: no suprapubic tenderness Extremities: no edema, no joint deformities, uses rolling walker Skin: no rashes Neurological:  Non-focal, oriented to self  Stephanie G [place], NP

## 2018-05-28 ENCOUNTER — Encounter: Payer: Self-pay | Admitting: Nurse Practitioner

## 2018-06-14 ENCOUNTER — Encounter (HOSPITAL_COMMUNITY): Payer: Self-pay | Admitting: Emergency Medicine

## 2018-06-14 ENCOUNTER — Emergency Department (HOSPITAL_COMMUNITY): Payer: Medicare HMO

## 2018-06-14 ENCOUNTER — Other Ambulatory Visit: Payer: Self-pay

## 2018-06-14 ENCOUNTER — Emergency Department (HOSPITAL_COMMUNITY)
Admission: EM | Admit: 2018-06-14 | Discharge: 2018-06-14 | Disposition: A | Discharge status: Home / Self Care | Payer: Medicare HMO | Attending: Emergency Medicine | Admitting: Emergency Medicine

## 2018-06-14 DIAGNOSIS — Z79899 Other long term (current) drug therapy: Secondary | ICD-10-CM | POA: Diagnosis not present

## 2018-06-14 DIAGNOSIS — S42302A Unspecified fracture of shaft of humerus, left arm, initial encounter for closed fracture: Secondary | ICD-10-CM | POA: Diagnosis not present

## 2018-06-14 DIAGNOSIS — Y9210 Unspecified residential institution as the place of occurrence of the external cause: Secondary | ICD-10-CM | POA: Diagnosis not present

## 2018-06-14 DIAGNOSIS — I10 Essential (primary) hypertension: Secondary | ICD-10-CM | POA: Diagnosis not present

## 2018-06-14 DIAGNOSIS — S4992XA Unspecified injury of left shoulder and upper arm, initial encounter: Secondary | ICD-10-CM | POA: Diagnosis present

## 2018-06-14 DIAGNOSIS — W19XXXA Unspecified fall, initial encounter: Secondary | ICD-10-CM | POA: Insufficient documentation

## 2018-06-14 DIAGNOSIS — S42295A Other nondisplaced fracture of upper end of left humerus, initial encounter for closed fracture: Secondary | ICD-10-CM

## 2018-06-14 DIAGNOSIS — Y939 Activity, unspecified: Secondary | ICD-10-CM | POA: Insufficient documentation

## 2018-06-14 DIAGNOSIS — Y999 Unspecified external cause status: Secondary | ICD-10-CM | POA: Insufficient documentation

## 2018-06-14 DIAGNOSIS — Z91040 Latex allergy status: Secondary | ICD-10-CM | POA: Insufficient documentation

## 2018-06-14 DIAGNOSIS — Z7982 Long term (current) use of aspirin: Secondary | ICD-10-CM | POA: Insufficient documentation

## 2018-06-14 MED ORDER — HYDROCODONE-ACETAMINOPHEN 5-325 MG PO TABS: 1.0000 | ORAL_TABLET | Freq: Once | ORAL | Status: DC

## 2018-06-14 MED ORDER — HYDROCODONE-ACETAMINOPHEN 7.5-325 MG/15ML PO SOLN
10.0000 mL | Freq: Once | ORAL | Status: AC
  Administered 2018-06-14: 10 mL via ORAL
  Filled 2018-06-14: qty 15

## 2018-06-14 MED ORDER — HYDROCODONE-ACETAMINOPHEN 5-325 MG PO TABS: 1.0000 | ORAL_TABLET | Freq: Four times a day (QID) | ORAL | 0 refills | Status: DC | PRN

## 2018-06-14 NOTE — ED Triage Notes (Signed)
Pt arrives to the ED with daughter after having a fall this am and landing on her right shoulder. At 0600 this morning she fell while walking with out her walker. Pt is guarding her right shoulder and states she is unable to move it.

## 2018-06-14 NOTE — ED Notes (Signed)
While in waiting room this tech took the pt to the restroom. Follows commands and pt is very hard of hearing.

## 2018-06-14 NOTE — ED Provider Notes (Signed)
[facility] EMERGENCY DEPARTMENT Provider Note   CSN: [id number] Arrival date & time: 06/14/18  1437   History   Chief Complaint Chief Complaint  Patient presents with  . Shoulder Injury    HPI Azra Trussel is a 84 y.o. female.  HPI level 5 caveat due to dementia  84 year old female presents today status post fall.  Patient coming from memory care unit at [facility].  Per documentation she had a mechanical fall hitting her face on the doorway and right shoulder.  They note she is supposed to be using a walker but was not using her walker.  Patient does not recall the event but notes pain in her left shoulder, she denies pain anywhere else.  Daughters at bedside reports she is at her baseline.     Past Medical History:  Diagnosis Date  . Anxiety   . CVA (cerebral vascular accident) (HCC) 11/10/2016   daughter reports "made the dementia worse" (05/23/2017)  . Dementia    "moderate" (05/23/2017)  . GERD (gastroesophageal reflux disease)   . Hypertension   . Seizures (HCC) ~ 03/2017   "as a result of the stroker" (05/23/2017)    Patient Active Problem List   Diagnosis Date Noted  . Essential hypertension 05/22/2017  . Seizures (HCC) 05/22/2017  . Alzheimer's type dementia with late onset without behavioral disturbance 05/22/2017  . Normocytic anemia 05/22/2017  . Hypokalemia 05/22/2017  . Closed intertrochanteric fracture of hip, right, initial encounter (HCC) 05/21/2017    Past Surgical History:  Procedure Laterality Date  . APPENDECTOMY    . BREAST CYST EXCISION    . FEMUR IM NAIL Right 05/22/2017  . FEMUR IM NAIL Right 05/22/2017   Procedure: INTRAMEDULLARY (IM) NAIL FEMORAL;  Surgeon: Gean Birchwood, MD;  Location: [facility] OR;  Service: Orthopedics;  Laterality: Right;  . FRACTURE SURGERY    . TONSILLECTOMY       OB History   None      Home Medications    Prior to Admission medications   Medication Sig Start Date End Date Taking?  Authorizing Provider  acetaminophen (TYLENOL) 500 MG tablet Take 500 mg by mouth every 4 (four) hours as needed for mild pain.    [provider]  ALPRAZolam Prudy Feeler) 0.5 MG tablet Take 0.25-0.5 mg by mouth See admin instructions. Take 0.25 mg in the morning and 0.5 mg at bedtime    [provider]  ALPRAZolam (XANAX) 0.5 MG tablet Take 0.5 mg by mouth every 4 (four) hours as needed for anxiety.    [provider]  alum & mag hydroxide-simeth (MAALOX PLUS) 400-400-40 MG/5ML suspension Take 15 mLs by mouth every 6 (six) hours as needed for indigestion.    [provider]  aspirin EC 325 MG tablet Take 1 tablet (325 mg total) by mouth 2 (two) times daily. 05/22/17   Allena Katz, PA-C  carvedilol (COREG) 12.5 MG tablet Take 12.5 mg by mouth 2 (two) times daily with a meal.    [provider]  cholecalciferol (VITAMIN D) 1000 units tablet Take 2,000 Units by mouth daily.    [provider]  Cranberry 425 MG CAPS Take 425 mg by mouth 2 (two) times daily.    [provider]  HYDROcodone-acetaminophen (NORCO/VICODIN) 5-325 MG tablet Take 1 tablet by mouth every 6 (six) hours as needed for severe pain. 06/14/18   Diala Waxman, Tinnie Gens, PA-C  levETIRAcetam (KEPPRA) 100 MG/ML solution Take 750 mg by mouth 2 (two) times daily.  7.5 ml    [provider]  Melatonin 3 MG TABS Take 3 mg by mouth at bedtime.    [provider]  nitrofurantoin (MACRODANTIN) 100 MG capsule Take 100 mg by mouth 2 (two) times daily.    [provider]  OVER THE COUNTER MEDICATION Place 2 drops into both ears once a week. Heavy Mineral oil    [provider]  phenazopyridine (PYRIDIUM) 100 MG tablet Take 200 mg by mouth every 8 (eight) hours as needed (Dysuria).     [provider]  ranitidine (RANITIDINE 150 MAX STRENGTH) 150 MG tablet Take 150 mg by mouth at bedtime.    [provider]  senna-docusate (SENOKOT-S) 8.6-50 MG  tablet Take 1 tablet by mouth daily.    [provider]  sertraline (ZOLOFT) 100 MG tablet Take 150 mg by mouth daily.     [provider]  tiZANidine (ZANAFLEX) 2 MG tablet Take 1 tablet (2 mg total) by mouth every 6 (six) hours as needed for muscle spasms. 05/22/17   Allena Katz, PA-C  vitamin B-12 (CYANOCOBALAMIN) 500 MCG tablet Take 500 mcg by mouth daily.     [provider]    Family History Family History  Problem Relation Age of Onset  . Diabetes Mother     Social History Social History   Tobacco Use  . Smoking status: Never Smoker  . Smokeless tobacco: Never Used  Substance Use Topics  . Alcohol use: No  . Drug use: No     Allergies   Lactose intolerance (gi); Latex; Paxil [paroxetine hcl]; and Pollen extract   Review of Systems Review of Systems  All other systems reviewed and are negative.    Physical Exam Updated Vital Signs BP (!) 179/73 (BP Location: Right Arm)   Pulse 69   Temp 97.7 F (36.5 C) (Oral)   Resp 18   SpO2 100%   Physical Exam  Constitutional: She is oriented to person, place, and time. She appears well-developed and well-nourished.  HENT:  Head: Normocephalic and atraumatic.  Eyes: Pupils are equal, round, and reactive to light. Conjunctivae are normal. Right eye exhibits no discharge. Left eye exhibits no discharge. No scleral icterus.  Neck: Normal range of motion. No JVD present. No tracheal deviation present.  Pulmonary/Chest: Effort normal. No stridor. No respiratory distress. She exhibits no tenderness.  Abdominal: Soft. She exhibits no distension. There is no tenderness.  Musculoskeletal:  Bruising noted to the left upper arm, no open wounds- there is palpation of the proximal humerus, grip strength 5 out of 5 radial pulse 2+- unable to range her shoulder secondary to discomfort- remainder of extremity nontender to palpation  No CT or L-spine tenderness palpation, back atraumatic, hips stable  bilateral with AP and lateral compression bilateral lower extremity is nontender to palpation  Patient ambulated with no signs of discomfort  Neurological: She is alert and oriented to person, place, and time. Coordination normal.  Psychiatric: She has a normal mood and affect. Her behavior is normal. Judgment and thought content normal.  Nursing note and vitals reviewed.    ED Treatments / Results  Labs (all labs ordered are listed, but only abnormal results are displayed) Labs Reviewed - No data to display  EKG None  Radiology Dg Shoulder Left  Result Date: 06/14/2018 CLINICAL DATA:  Patient with dementia fell - no other details known. Generalized left shoulder pain and proximal humeral pain with movement and touch. EXAM: LEFT SHOULDER - 2+ VIEW  COMPARISON:  Chest x-ray 03/10/2018 FINDINGS: There is mild irregularity along the mediolateral aspect of the humeral neck, associated with faint sclerotic line at the humeral neck. The findings are suspicious for nondisplaced LEFT humeral neck fracture. No dislocation. LEFT lung apex is unremarkable. IMPRESSION: Findings suspicious for nondisplaced LEFT humeral neck fracture. Electronically Signed   By: Norva Pavlov M.D.   On: 06/14/2018 15:54    Procedures Procedures (including critical care time)  Medications Ordered in ED Medications  HYDROcodone-acetaminophen (HYCET) 7.5-325 mg/15 ml solution 10 mL (10 mLs Oral Given 06/14/18 1929)     Initial Impression / Assessment and Plan / ED Course  I have reviewed the triage vital signs and the nursing notes.  Pertinent labs & imaging results that were available during my care of the patient were reviewed by me and considered in my medical decision making (see chart for details).     Labs:   Imaging: DG shoulder left  Consults:  Therapeutics:  Discharge Meds: Norco  Assessment/Plan: 84 year old female presents today with shoulder fracture. Patient has no open wounds, no  significant displacement. Patient will be stable for sling with outpatient orthopedic follow-up. Patient in significant discomfort here, she was given a Norco. Patient will be wheelchair bound at this point she cannot safely ambulate without the assistance of a walker. Patient will be given several Norco was to be taken as needed for significant pain, not to be used in conjunction with other sedating medications. Patient will follow up as an outpatient with orthopedic specialist, family given return precautions, they verbalized understanding and agreement to today's plan had no further questions or concerns.     Final Clinical Impressions(s) / ED Diagnoses   Final diagnoses:  Other closed nondisplaced fracture of proximal end of left humerus, initial encounter    ED Discharge Orders         Ordered    HYDROcodone-acetaminophen (NORCO/VICODIN) 5-325 MG tablet  Every 6 hours PRN     06/14/18 1913           Eyvonne Mechanic, PA-C 06/14/18 1948    Sabas Sous, MD 06/14/18 2310

## 2018-06-14 NOTE — Discharge Instructions (Addendum)
Please read attached information. If you experience any new or worsening signs or symptoms please return to the emergency room for evaluation. Please follow-up with your primary care provider or specialist as discussed. Please use medication prescribed only as directed and discontinue taking if you have any concerning signs or symptoms.  Please do not use in conjunction with your prescribed Xanax.

## 2018-06-14 NOTE — ED Notes (Signed)
Patient verbalizes understanding of discharge instructions. Opportunity for questioning and answers were provided. Armband removed by staff, pt discharged from ED in wheelchair with daughter.   

## 2018-08-30 ENCOUNTER — Non-Acute Institutional Stay: Payer: Medicare HMO | Admitting: Internal Medicine

## 2018-08-30 ENCOUNTER — Encounter: Payer: Self-pay | Admitting: Internal Medicine

## 2018-08-30 VITALS — BP 114/80 | HR 68 | Resp 12 | Ht 63.0 in | Wt 103.0 lb

## 2018-08-30 DIAGNOSIS — F0280 Dementia in other diseases classified elsewhere without behavioral disturbance: Secondary | ICD-10-CM

## 2018-08-30 DIAGNOSIS — G301 Alzheimer's disease with late onset: Principal | ICD-10-CM

## 2018-08-30 NOTE — Progress Notes (Signed)
[facility] Telephone: [phone] Fax: [phone]  PATIENT NAME: Lynn Reynolds DOB: 22-Mar-1931 MRN: [id number]  PRIMARY CARE PROVIDER:   System, Pcp Not In  REFERRING PROVIDER:  No referring provider defined for this encounter.  RESPONSIBLE PARTY:   Leafy Halfam Powell (DTR) [phone]   INTERVAL HISTORY: Lynn Reynolds is a 84 y.o.female with history significant for alzheimer's dementia, gait disturbance, fall with fractured right hip (05/2017), and FTT. This is a routine Palliative Care follow up visit (last seen 05/18/18). Since last seen, she had suffered a fall with a non-displaced L shoulder fracture (humeral neck), managed conservatively.    ASSESSMENT/RECOMMENDATIONS:  1. Alzheimer dementia with behavioral disturbances: Staff report evening anxious agitation when she can be physically abusive to staff. Sometimes staff are able to redirect, but also well managed with prn Xanax. Patient's daughter or a sitter stay with patient most evenings. Patient uses her walker with good technique; may need reminding for consistent use. Her weight is 103 lbs, which is stable from her June weight of 105lbs. At height of 5'3" her BMI is 18.6kg/m2.  2. Advanced Care directives: [name] is a full code.  She does have a living will on the chart expressing wish for no life polong procedures if thought to have a terminal condition; wish for a natural death.  -I will contact [facility] Pam to further explore. Consider DNR.   I spent 45 minutes providing this consultation,  from 11am to 11:45am. More than 50% of the time in this consultation was spent coordinating communication.   CODE STATUS: Full  PPS: 40% HOSPICE ELIGIBILITY/DIAGNOSIS: TBD  PAST MEDICAL HISTORY:  Past Medical History:  Diagnosis Date  . Anxiety   . CVA (cerebral vascular accident) (HCC) 11/10/2016   daughter reports "made the dementia worse" (05/23/2017)  . Dementia    "moderate" (05/23/2017)  . GERD (gastroesophageal  reflux disease)   . Hypertension   . Seizures (HCC) ~ 03/2017   "as a result of the stroker" (05/23/2017)    SOCIAL HX:  Social History   Tobacco Use  . Smoking status: Never Smoker  . Smokeless tobacco: Never Used  Substance Use Topics  . Alcohol use: No    ALLERGIES:  Allergies  Allergen Reactions  . Lactose Intolerance (Gi)     unknown  . Latex     unknown  . Paxil [Paroxetine Hcl]     unknown  . Pollen Extract     unknown     PERTINENT MEDICATIONS:  Outpatient Encounter Medications as of 82/21/2019  Medication Sig  . acetaminophen (TYLENOL) 500 MG tablet Take 500 mg by mouth 2 (two) times daily.   Marland Kitchen. ALPRAZolam (XANAX) 0.5 MG tablet Take 0.25-0.5 mg by mouth See admin instructions. Take 0.25 mg in the morning and 0.5 mg at bedtime  . ALPRAZolam (XANAX) 0.5 MG tablet Take 0.5 mg by mouth every 4 (four) hours as needed for anxiety.  Marland Kitchen. alum & mag hydroxide-simeth (MAALOX PLUS) 400-400-40 MG/5ML suspension Take 15 mLs by mouth every 6 (six) hours as needed for indigestion.  Marland Kitchen. amLODipine (NORVASC) 10 MG tablet Take 10 mg by mouth daily.  . carvedilol (COREG) 25 MG tablet Take 12.5 mg by mouth 2 (two) times daily with a meal.   . cholecalciferol (VITAMIN D) 1000 units tablet Take 2,000 Units by mouth daily.  . Cranberry 425 MG CAPS Take 425 mg by mouth 2 (two) times daily.  . famotidine (PEPCID) 40 MG tablet Take 40 mg by mouth at bedtime.  .Marland Kitchen  hydrALAZINE (APRESOLINE) 50 MG tablet Take 50 mg by mouth 3 (three) times daily.  Marland Kitchen levETIRAcetam (KEPPRA) 100 MG/ML solution Take 750 mg by mouth 2 (two) times daily. 7.5 ml  . lisinopril (PRINIVIL,ZESTRIL) 20 MG tablet Take 20 mg by mouth daily. Two tablets by mouth once a day  . LORazepam (ATIVAN) 0.5 MG tablet Take 0.5 mg by mouth 1 day or 1 dose. 1/2 tab (0.25mg ) daily at 2pm  . Melatonin 3 MG TABS Take 3 mg by mouth at bedtime.  Marland Kitchen omeprazole (PRILOSEC) 40 MG capsule Take 40 mg by mouth daily.  Marland Kitchen OVER THE COUNTER MEDICATION Place 2  drops into both ears once a week. Heavy Mineral oil  . phenazopyridine (PYRIDIUM) 100 MG tablet Take 200 mg by mouth every 8 (eight) hours as needed (Dysuria).   . senna-docusate (SENOKOT-S) 8.6-50 MG tablet Take 1 tablet by mouth daily.  . sertraline (ZOLOFT) 100 MG tablet Take 150 mg by mouth daily.   . vitamin B-12 (CYANOCOBALAMIN) 500 MCG tablet Take 500 mcg by mouth daily.   . [DISCONTINUED] aspirin EC 325 MG tablet Take 1 tablet (325 mg total) by mouth 2 (two) times daily.  . [DISCONTINUED] HYDROcodone-acetaminophen (NORCO/VICODIN) 5-325 MG tablet Take 1 tablet by mouth every 6 (six) hours as needed for severe pain.  . [DISCONTINUED] nitrofurantoin (MACRODANTIN) 100 MG capsule Take 100 mg by mouth 2 (two) times daily.  . [DISCONTINUED] ranitidine (RANITIDINE 150 MAX STRENGTH) 150 MG tablet Take 150 mg by mouth at bedtime.  . [DISCONTINUED] tiZANidine (ZANAFLEX) 2 MG tablet Take 1 tablet (2 mg total) by mouth every 6 (six) hours as needed for muscle spasms.   No facility-administered encounter medications on file as of 82/21/2019.     PHYSICAL EXAM:  VS: BP 114/80, HR 68, RR 12 General: NAD, frail appearing, thin. Sitting up and asleep on her couch. Awoke easily to my voice. Pleasantly confused. Asking about her daughter Elita Quick. Rambling but clear speech. Able to follow simple commands Cardiovascular: regular rate and rhythm Pulmonary: clear ant fields Abdomen: soft, nontender, + bowel sounds GU: no suprapubic tenderness Extremities: no edema, no joint deformities Skin: no rashes Neurological: Weakness but otherwise nonfocal  Anselm Lis, NP

## 2018-08-31 ENCOUNTER — Telehealth: Payer: Self-pay | Admitting: Internal Medicine

## 2018-08-31 NOTE — Telephone Encounter (Signed)
TC to Smith InternationalPam Powell (DTR) [phone]. Pam mentioned that her mom was currently undergoing treatment for a UTI. That her mom was without pain from her prior shoulder fracture. We discussed Advanced Care Directives, and specifically DNR. Pam stated her desire to have a DNR order placed for the patient. I will add this to the patient's chart. We also briefly discussed components of the MOST form. I will e-mail her a companion patient education form ([email or web address]) and plan to meet in the near future to further discuss and sign.  Holly BodilyMary Delquan Poucher NP-C

## 2018-09-19 ENCOUNTER — Emergency Department (HOSPITAL_COMMUNITY): Payer: Medicare HMO

## 2018-09-19 ENCOUNTER — Emergency Department (HOSPITAL_COMMUNITY)
Admission: EM | Admit: 2018-09-19 | Discharge: 2018-09-19 | Payer: Medicare HMO | Attending: Emergency Medicine | Admitting: Emergency Medicine

## 2018-09-19 ENCOUNTER — Encounter (HOSPITAL_COMMUNITY): Payer: Self-pay

## 2018-09-19 ENCOUNTER — Other Ambulatory Visit: Payer: Self-pay

## 2018-09-19 DIAGNOSIS — I10 Essential (primary) hypertension: Secondary | ICD-10-CM | POA: Insufficient documentation

## 2018-09-19 DIAGNOSIS — Z91040 Latex allergy status: Secondary | ICD-10-CM | POA: Diagnosis not present

## 2018-09-19 DIAGNOSIS — N3001 Acute cystitis with hematuria: Secondary | ICD-10-CM

## 2018-09-19 DIAGNOSIS — R4182 Altered mental status, unspecified: Secondary | ICD-10-CM | POA: Diagnosis present

## 2018-09-19 DIAGNOSIS — E860 Dehydration: Secondary | ICD-10-CM | POA: Insufficient documentation

## 2018-09-19 DIAGNOSIS — Z79899 Other long term (current) drug therapy: Secondary | ICD-10-CM | POA: Insufficient documentation

## 2018-09-19 DIAGNOSIS — R55 Syncope and collapse: Secondary | ICD-10-CM | POA: Insufficient documentation

## 2018-09-19 LAB — CBC WITH DIFFERENTIAL/PLATELET
Abs Immature Granulocytes: 0.05 10*3/uL (ref 0.00–0.07)
BASOS PCT: 0 %
Basophils Absolute: 0 10*3/uL (ref 0.0–0.1)
Eosinophils Absolute: 0.1 10*3/uL (ref 0.0–0.5)
Eosinophils Relative: 1 %
HCT: 32.8 % — ABNORMAL LOW (ref 36.0–46.0)
Hemoglobin: 10.3 g/dL — ABNORMAL LOW (ref 12.0–15.0)
Immature Granulocytes: 1 %
Lymphocytes Relative: 14 %
Lymphs Abs: 1.1 10*3/uL (ref 0.7–4.0)
MCH: 31.1 pg (ref 26.0–34.0)
MCHC: 31.4 g/dL (ref 30.0–36.0)
MCV: 99.1 fL (ref 80.0–100.0)
Monocytes Absolute: 0.4 10*3/uL (ref 0.1–1.0)
Monocytes Relative: 5 %
Neutro Abs: 6.2 10*3/uL (ref 1.7–7.7)
Neutrophils Relative %: 79 %
Platelets: 211 10*3/uL (ref 150–400)
RBC: 3.31 MIL/uL — ABNORMAL LOW (ref 3.87–5.11)
RDW: 14.1 % (ref 11.5–15.5)
WBC: 7.9 10*3/uL (ref 4.0–10.5)
nRBC: 0 % (ref 0.0–0.2)

## 2018-09-19 LAB — URINALYSIS, ROUTINE W REFLEX MICROSCOPIC
Bilirubin Urine: NEGATIVE
Glucose, UA: NEGATIVE mg/dL
Ketones, ur: NEGATIVE mg/dL
NITRITE: POSITIVE — AB
PH: 6 (ref 5.0–8.0)
Protein, ur: 100 mg/dL — AB
Specific Gravity, Urine: 1.012 (ref 1.005–1.030)

## 2018-09-19 LAB — COMPREHENSIVE METABOLIC PANEL
ALBUMIN: 3.4 g/dL — AB (ref 3.5–5.0)
ALT: 9 U/L (ref 0–44)
AST: 32 U/L (ref 15–41)
Alkaline Phosphatase: 56 U/L (ref 38–126)
Anion gap: 10 (ref 5–15)
BUN: 19 mg/dL (ref 8–23)
CO2: 20 mmol/L — ABNORMAL LOW (ref 22–32)
Calcium: 8.7 mg/dL — ABNORMAL LOW (ref 8.9–10.3)
Chloride: 109 mmol/L (ref 98–111)
Creatinine, Ser: 1.23 mg/dL — ABNORMAL HIGH (ref 0.44–1.00)
GFR calc Af Amer: 46 mL/min — ABNORMAL LOW (ref >60)
GFR calc non Af Amer: 40 mL/min — ABNORMAL LOW (ref >60)
Glucose, Bld: 122 mg/dL — ABNORMAL HIGH (ref 70–99)
Potassium: 5.5 mmol/L — ABNORMAL HIGH (ref 3.5–5.1)
Sodium: 139 mmol/L (ref 135–145)
Total Bilirubin: 1.3 mg/dL — ABNORMAL HIGH (ref 0.3–1.2)
Total Protein: 6.1 g/dL — ABNORMAL LOW (ref 6.5–8.1)

## 2018-09-19 LAB — C DIFFICILE QUICK SCREEN W PCR REFLEX
C Diff antigen: NEGATIVE
C Diff interpretation: NOT DETECTED
C Diff toxin: NEGATIVE

## 2018-09-19 LAB — I-STAT CG4 LACTIC ACID, ED: Lactic Acid, Venous: 2.47 mmol/L (ref 0.5–1.9)

## 2018-09-19 MED ORDER — SODIUM CHLORIDE 0.9 % IV SOLN
INTRAVENOUS | Status: DC | PRN
  Administered 2018-09-19: 1000 mL via INTRAVENOUS

## 2018-09-19 MED ORDER — LACTATED RINGERS IV BOLUS (SEPSIS)
1000.0000 mL | Freq: Once | INTRAVENOUS | Status: AC
  Administered 2018-09-19: 1000 mL via INTRAVENOUS

## 2018-09-19 MED ORDER — LACTATED RINGERS IV BOLUS (SEPSIS)
500.0000 mL | Freq: Once | INTRAVENOUS | Status: AC
  Administered 2018-09-19: 500 mL via INTRAVENOUS

## 2018-09-19 MED ORDER — SODIUM CHLORIDE 0.9 % IV SOLN
1.0000 g | Freq: Once | INTRAVENOUS | Status: AC
  Administered 2018-09-19: 1 g via INTRAVENOUS
  Filled 2018-09-19: qty 10

## 2018-09-19 MED ORDER — CEPHALEXIN 500 MG PO CAPS: 500.0000 mg | ORAL_CAPSULE | Freq: Three times a day (TID) | ORAL | 0 refills | Status: DC

## 2018-09-19 NOTE — ED Notes (Signed)
ED Provider at bedside. 

## 2018-09-19 NOTE — ED Triage Notes (Signed)
Pt arrived [facility] from [facility] with reports of AMS with LSN at lunch (around 12 today). Pt is on memory care unit and staff reporrts c/o abdominal pain and being cold. Staff reports to EMS that pt finished recent treatment for UTI. Pt alert and speaking with staff upon arrival. Initial BP with EMS was 60/40 after 1 L NS 101/67

## 2018-09-19 NOTE — ED Provider Notes (Signed)
[facility] EMERGENCY DEPARTMENT Provider Note   CSN: [id number] Arrival date & time: 09/19/18  1308     History   Chief Complaint Chief Complaint  Patient presents with  . Altered Mental Status   Level 5 caveat: Dementia  HPI Lynn Reynolds is a 84 y.o. female.  HPI Patient is an 84 year old female who had a syncopal episode today at the nursing home per the daughter.  On EMS arrival the patient's blood pressure was 60/40.  She was given a liter of normal saline in route with a blood pressure of 101/67 after that.  Patient is otherwise been in her normal state of health lately per the daughter.  Patient has advanced dementia.  At time daughter has arrived to the emergency department she states that her mother is at baseline at this time.  Her mother has recently been involved with palliative care.  Family is preparing to fill out a [employer] form.  History of recurrent UTIs.  No recent illness.  No fevers.  No cough or congestion per the daughter.  Reportedly had one episode of nonbloody diarrhea in the ER.   Past Medical History:  Diagnosis Date  . Anxiety   . CVA (cerebral vascular accident) (HCC) 11/10/2016   daughter reports "made the dementia worse" (05/23/2017)  . Dementia (HCC)    "moderate" (05/23/2017)  . GERD (gastroesophageal reflux disease)   . Hypertension   . Seizures (HCC) ~ 03/2017   "as a result of the stroker" (05/23/2017)    Patient Active Problem List   Diagnosis Date Noted  . Essential hypertension 05/22/2017  . Seizures (HCC) 05/22/2017  . Alzheimer's type dementia with late onset without behavioral disturbance (HCC) 05/22/2017  . Normocytic anemia 05/22/2017  . Hypokalemia 05/22/2017  . Closed intertrochanteric fracture of hip, right, initial encounter (HCC) 05/21/2017    Past Surgical History:  Procedure Laterality Date  . APPENDECTOMY    . BREAST CYST EXCISION    . FEMUR IM NAIL Right 05/22/2017  . FEMUR IM NAIL  Right 05/22/2017   Procedure: INTRAMEDULLARY (IM) NAIL FEMORAL;  Surgeon: Gean Birchwood, MD;  Location: [facility] OR;  Service: Orthopedics;  Laterality: Right;  . FRACTURE SURGERY    . TONSILLECTOMY       OB History   None      Home Medications    Prior to Admission medications   Medication Sig Start Date End Date Taking? Authorizing Provider  acetaminophen (TYLENOL) 500 MG tablet Take 500 mg by mouth 2 (two) times daily.    Yes [provider]  ALPRAZolam Prudy Feeler) 0.5 MG tablet Take 0.25-0.5 mg by mouth See admin instructions. Take 0.25 mg in the morning and 0.5 mg at bedtime   Yes [provider]  ALPRAZolam (XANAX) 0.5 MG tablet Take 0.5 mg by mouth daily as needed for anxiety.    Yes [provider]  alum & mag hydroxide-simeth (MAALOX PLUS) 400-400-40 MG/5ML suspension Take 15 mLs by mouth every 6 (six) hours as needed for indigestion.   Yes [provider]  amLODipine (NORVASC) 10 MG tablet Take 10 mg by mouth daily.   Yes [provider]  carvedilol (COREG) 25 MG tablet Take 25 mg by mouth 2 (two) times daily with a meal. Hold if SBP <100 or DBP <60   Yes [provider]  cholecalciferol (VITAMIN D) 1000 units tablet Take 2,000 Units by mouth daily.   Yes [provider]  Cranberry 425 MG CAPS Take  425 mg by mouth 2 (two) times daily.   Yes [provider]  famotidine (PEPCID) 40 MG tablet Take 40 mg by mouth at bedtime.   Yes [provider]  hydrALAZINE (APRESOLINE) 50 MG tablet Take 50 mg by mouth 3 (three) times daily.   Yes [provider]  levETIRAcetam (KEPPRA) 100 MG/ML solution Take 750 mg by mouth 2 (two) times daily. 7.5 ml   Yes [provider]  lisinopril (PRINIVIL,ZESTRIL) 20 MG tablet Take 40 mg by mouth daily. Two tablets by mouth once a day    Yes [provider]  LORazepam (ATIVAN) 0.5 MG tablet Take 0.5 mg by mouth 1 day or 1 dose. 1/2 tab (0.25mg ) daily at 2pm    Yes [provider]  Melatonin 3 MG TABS Take 3 mg by mouth at bedtime.   Yes [provider]  omeprazole (PRILOSEC) 40 MG capsule Take 40 mg by mouth daily.   Yes [provider]  OVER THE COUNTER MEDICATION Place 2 drops into both ears once a week. Heavy Mineral oil   Yes [provider]  senna-docusate (SENOKOT-S) 8.6-50 MG tablet Take 1 tablet by mouth See admin instructions. Every other day as needed.   Yes [provider]  sertraline (ZOLOFT) 100 MG tablet Take 150 mg by mouth daily.    Yes [provider]  vitamin B-12 (CYANOCOBALAMIN) 500 MCG tablet Take 500 mcg by mouth daily.    Yes [provider]  cephALEXin (KEFLEX) 500 MG capsule Take 1 capsule (500 mg total) by mouth 3 (three) times daily. 09/19/18   Azalia Bilis, MD    Family History Family History  Problem Relation Age of Onset  . Diabetes Mother     Social History Social History   Tobacco Use  . Smoking status: Never Smoker  . Smokeless tobacco: Never Used  Substance Use Topics  . Alcohol use: No  . Drug use: No     Allergies   Lactose intolerance (gi); Latex; Paxil [paroxetine hcl]; and Pollen extract   Review of Systems Review of Systems  Unable to perform ROS: Dementia     Physical Exam Updated Vital Signs BP (!) 149/72   Pulse 95   Temp (!) 97.3 F (36.3 C) (Rectal)   Resp (!) 23   Ht 5\' 3"  (1.6 m)   Wt 46.7 kg   SpO2 99%   BMI 18.24 kg/m   Physical Exam  Constitutional: She appears well-developed and well-nourished. No distress.  HENT:  Head: Normocephalic and atraumatic.  Eyes: EOM are normal.  Neck: Normal range of motion.  Cardiovascular: Normal rate, regular rhythm and normal heart sounds.  Pulmonary/Chest: Effort normal and breath sounds normal.  Abdominal: Soft. She exhibits no distension. There is no tenderness.  Musculoskeletal: Normal range of motion.  Full range of motion bilateral upper and lower extremity  major joints.  Neurological: She is alert.  5 out of 5 strength in bilateral upper and lower extremity major muscle groups grossly.  Skin: Skin is warm and dry.  Psychiatric: She has a normal mood and affect. Judgment normal.  Nursing note and vitals reviewed.    ED Treatments / Results  Labs (all labs ordered are listed, but only abnormal results are displayed) Labs Reviewed  COMPREHENSIVE METABOLIC PANEL - Abnormal; Notable for the following components:      Result Value   Potassium 5.5 (*)    CO2 20 (*)    Glucose, Bld 122 (*)  Creatinine, Ser 1.23 (*)    Calcium 8.7 (*)    Total Protein 6.1 (*)    Albumin 3.4 (*)    Total Bilirubin 1.3 (*)    GFR calc non Af Amer 40 (*)    GFR calc Af Amer 46 (*)    All other components within normal limits  CBC WITH DIFFERENTIAL/PLATELET - Abnormal; Notable for the following components:   RBC 3.31 (*)    Hemoglobin 10.3 (*)    HCT 32.8 (*)    All other components within normal limits  URINALYSIS, ROUTINE W REFLEX MICROSCOPIC - Abnormal; Notable for the following components:   Hgb urine dipstick SMALL (*)    Protein, ur 100 (*)    Nitrite POSITIVE (*)    Leukocytes, UA MODERATE (*)    Bacteria, UA FEW (*)    All other components within normal limits  I-STAT CG4 LACTIC ACID, ED - Abnormal; Notable for the following components:   Lactic Acid, Venous 2.47 (*)    All other components within normal limits  CULTURE, BLOOD (ROUTINE X 2)  CULTURE, BLOOD (ROUTINE X 2)  URINE CULTURE  I-STAT CG4 LACTIC ACID, ED    EKG EKG Interpretation  Date/Time:  Wednesday September 19 2018 13:26:22 EST Ventricular Rate:  70 PR Interval:    QRS Duration: 99 QT Interval:  435 QTC Calculation: 470 R Axis:   136 Text Interpretation:  sinus rhythm Right axis deviation Low voltage, extremity leads Borderline repolarization abnormality No significant change was found Confirmed by Azalia Bilisampos, Jud Fanguy ([id number]) on 09/19/2018 4:29:11 PM   Radiology Dg Chest  Port 1 View  Result Date: 09/19/2018 CLINICAL DATA:  Possible subcapital fracture of the proximal left humerus. A left humerus series is recommended. EXAM: PORTABLE CHEST 1 VIEW COMPARISON:  PA and lateral chest x-ray of March 10, 2018 FINDINGS: The lungs are reasonably well inflated. There are slightly increased lung markings at the left base which are new. There's no pleural effusion. The heart is top-normal in size. The pulmonary vascularity is normal. The mediastinum is normal in width. The trachea is midline. The bony thorax exhibits no acute abnormality. There is an abnormal appearance of the left humeral head-neck junction consistent with an impacted fracture. This is more conspicuous than on the shoulder series of June 14, 2018. IMPRESSION: Probable subsegmental atelectasis at the left lung base. No alveolar pneumonia nor pulmonary edema. Electronically Signed   By: David  SwazilandJordan M.D.   On: 09/19/2018 13:59    Procedures Procedures (including critical care time)  Medications Ordered in ED Medications  cefTRIAXone (ROCEPHIN) 1 g in sodium chloride 0.9 % 100 mL IVPB (has no administration in time range)  lactated ringers bolus 1,000 mL (0 mLs Intravenous Stopped 09/19/18 1556)    And  lactated ringers bolus 500 mL (0 mLs Intravenous Stopped 09/19/18 1542)     Initial Impression / Assessment and Plan / ED Course  I have reviewed the triage vital signs and the nursing notes.  Pertinent labs & imaging results that were available during my care of the patient were reviewed by me and considered in my medical decision making (see chart for details).     Blood pressure improved with IV fluids.  Suspect dehydration.  Afebrile emergency department.  Labs besides urinary tract infection otherwise without significant abnormality.  History of recurrent UTIs.  Urine culture sent.  Blood cultures obtained.  IV Rocephin now.  I had a long discussion with the patient's daughter who agrees that she  is best served back at her memory care unit given her advanced age and history of dementia.  I think putting her in the hospital increases her risk of delirium multifold.  Baseline mental status.  IV fluids received.  IV antibiotics now.  Home with Keflex.  Close PCP follow-up.  Instructed to return to the ER for new or worsening symptoms.   Final Clinical Impressions(s) / ED Diagnoses   Final diagnoses:  Acute cystitis with hematuria  Syncope, unspecified syncope type  Acute dehydration    ED Discharge Orders         Ordered    cephALEXin (KEFLEX) 500 MG capsule  3 times daily     09/19/18 1626           Azalia Bilis, MD 09/19/18 1631

## 2018-09-20 LAB — BLOOD CULTURE ID PANEL (REFLEXED)
Acinetobacter baumannii: NOT DETECTED
CANDIDA KRUSEI: NOT DETECTED
CANDIDA TROPICALIS: NOT DETECTED
Candida albicans: NOT DETECTED
Candida glabrata: NOT DETECTED
Candida parapsilosis: NOT DETECTED
Enterobacter cloacae complex: NOT DETECTED
Enterobacteriaceae species: NOT DETECTED
Enterococcus species: NOT DETECTED
Escherichia coli: NOT DETECTED
Haemophilus influenzae: NOT DETECTED
Klebsiella oxytoca: NOT DETECTED
Klebsiella pneumoniae: NOT DETECTED
Listeria monocytogenes: NOT DETECTED
Methicillin resistance: DETECTED — AB
Neisseria meningitidis: NOT DETECTED
Proteus species: NOT DETECTED
Pseudomonas aeruginosa: NOT DETECTED
STAPHYLOCOCCUS AUREUS BCID: NOT DETECTED
STREPTOCOCCUS PYOGENES: NOT DETECTED
STREPTOCOCCUS SPECIES: NOT DETECTED
Serratia marcescens: NOT DETECTED
Staphylococcus species: DETECTED — AB
Streptococcus agalactiae: NOT DETECTED
Streptococcus pneumoniae: NOT DETECTED

## 2018-09-22 LAB — URINE CULTURE: Culture: >=100000 — AB

## 2018-09-22 LAB — CULTURE, BLOOD (ROUTINE X 2): Special Requests: ADEQUATE

## 2018-09-23 ENCOUNTER — Telehealth: Payer: Self-pay

## 2018-09-23 NOTE — Telephone Encounter (Signed)
No treatment for UC from ED 09/19/18 per Harolyn RutherfordShawn Joy [facility]

## 2018-09-23 NOTE — Progress Notes (Signed)
ED Antimicrobial Stewardship Positive Culture Follow Up   Lynn Reynolds is an 84 y.o. female who [facility] with a chief complaint of  Chief Complaint  Patient presents with  . Altered Mental Status    Recent Results (from the past 720 hour(s))  Blood Culture (routine x 2)     Status: Abnormal   Collection Time: 09/19/18  3:15 PM  Result Value Ref Range Status   Specimen Description BLOOD LEFT ANTECUBITAL  Final   Special Requests   Final    BOTTLES DRAWN AEROBIC AND ANAEROBIC Blood Culture adequate volume   Culture  Setup Time   Final    GRAM POSITIVE COCCI IN CLUSTERS AEROBIC BOTTLE ONLY CRITICAL RESULT CALLED TO, READ BACK BY AND VERIFIED WITH: Pincus BadderM. Mabe RN 16:40 09/20/18 ([email or web address])    Culture (A)  Final    STAPHYLOCOCCUS SPECIES (COAGULASE NEGATIVE) THE SIGNIFICANCE OF ISOLATING THIS ORGANISM FROM A SINGLE SET OF BLOOD CULTURES WHEN MULTIPLE SETS ARE DRAWN IS UNCERTAIN. PLEASE NOTIFY THE MICROBIOLOGY DEPARTMENT WITHIN ONE WEEK IF SPECIATION AND SENSITIVITIES ARE REQUIRED. Performed at [facility], [address] [place], [place] [address]    Report Status 09/22/2018 FINAL  Final  Blood Culture ID Panel (Reflexed)     Status: Abnormal   Collection Time: 09/19/18  3:15 PM  Result Value Ref Range Status   Enterococcus species NOT DETECTED NOT DETECTED Final   Listeria monocytogenes NOT DETECTED NOT DETECTED Final   Staphylococcus species DETECTED (A) NOT DETECTED Final    Comment: Methicillin (oxacillin) resistant coagulase negative staphylococcus. Possible blood culture contaminant (unless isolated from more than one blood culture draw or clinical case suggests pathogenicity). No antibiotic treatment is indicated for blood  culture contaminants. CRITICAL RESULT CALLED TO, READ BACK BY AND VERIFIED WITH: M. Mabe RN 16:40 09/20/18 ([email or web address])    Staphylococcus aureus (BCID) NOT DETECTED NOT DETECTED Final   Methicillin resistance DETECTED (A) NOT  DETECTED Final    Comment: CRITICAL RESULT CALLED TO, READ BACK BY AND VERIFIED WITH: M. Mabe RN 16:40 09/20/18 (wilsonm)    Streptococcus species NOT DETECTED NOT DETECTED Final   Streptococcus agalactiae NOT DETECTED NOT DETECTED Final   Streptococcus pneumoniae NOT DETECTED NOT DETECTED Final   Streptococcus pyogenes NOT DETECTED NOT DETECTED Final   Acinetobacter baumannii NOT DETECTED NOT DETECTED Final   Enterobacteriaceae species NOT DETECTED NOT DETECTED Final   Enterobacter cloacae complex NOT DETECTED NOT DETECTED Final   Escherichia coli NOT DETECTED NOT DETECTED Final   Klebsiella oxytoca NOT DETECTED NOT DETECTED Final   Klebsiella pneumoniae NOT DETECTED NOT DETECTED Final   Proteus species NOT DETECTED NOT DETECTED Final   Serratia marcescens NOT DETECTED NOT DETECTED Final   Haemophilus influenzae NOT DETECTED NOT DETECTED Final   Neisseria meningitidis NOT DETECTED NOT DETECTED Final   Pseudomonas aeruginosa NOT DETECTED NOT DETECTED Final   Candida albicans NOT DETECTED NOT DETECTED Final   Candida glabrata NOT DETECTED NOT DETECTED Final   Candida krusei NOT DETECTED NOT DETECTED Final   Candida parapsilosis NOT DETECTED NOT DETECTED Final   Candida tropicalis NOT DETECTED NOT DETECTED Final    Comment: Performed at [facility], [address] [place], [place] [address]  Urine culture     Status: Abnormal   Collection Time: 09/19/18  3:38 PM  Result Value Ref Range Status   Specimen Description URINE, RANDOM  Final   Special Requests   Final    NONE Performed  at [facility], [address], [place], [place] [address]    Culture >=100,000 COLONIES/mL STAPHYLOCOCCUS AUREUS (A)  Final   Report Status 09/22/2018 FINAL  Final   Organism ID, Bacteria STAPHYLOCOCCUS AUREUS (A)  Final      Susceptibility   Staphylococcus aureus - MIC*    CIPROFLOXACIN >=8 RESISTANT Resistant     GENTAMICIN <=0.5 SENSITIVE Sensitive     NITROFURANTOIN <=16 SENSITIVE  Sensitive     OXACILLIN >=4 RESISTANT Resistant     TETRACYCLINE <=1 SENSITIVE Sensitive     VANCOMYCIN <=0.5 SENSITIVE Sensitive     TRIMETH/SULFA <=10 SENSITIVE Sensitive     CLINDAMYCIN RESISTANT Resistant     RIFAMPIN <=0.5 SENSITIVE Sensitive     Inducible Clindamycin POSITIVE Resistant     * >=100,000 COLONIES/mL STAPHYLOCOCCUS AUREUS  Blood Culture (routine x 2)     Status: None (Preliminary result)   Collection Time: 09/19/18  3:45 PM  Result Value Ref Range Status   Specimen Description BLOOD BLOOD RIGHT FOREARM  Final   Special Requests   Final    BOTTLES DRAWN AEROBIC AND ANAEROBIC Blood Culture results may not be optimal due to an inadequate volume of blood received in culture bottles   Culture   Final    NO GROWTH 3 DAYS Performed at [facility], [address] [place], [place] [address]    Report Status PENDING  Incomplete  C difficile quick scan w PCR reflex     Status: None   Collection Time: 09/19/18  4:52 PM  Result Value Ref Range Status   C Diff antigen NEGATIVE NEGATIVE Final   C Diff toxin NEGATIVE NEGATIVE Final   C Diff interpretation No C. difficile detected.  Final    Comment: Performed at [facility], [address], [place], [place] [address]   Suspect blood culture result represents contamination, given 1/2 CoNS. Urine culture result likely represents colonization, as this is very common in elderly women, and even more so in nursing home residents. Given no other s/sx of UTI or infection, likely represents asymptomatic bacteriuria and no further treatment is needed.   ED Provider: Harolyn Rutherford, PA-C  Roderic Scarce Zigmund Daniel, PharmD, BCPS PGY2 Infectious Diseases Pharmacy Resident Phone: [phone] 09/23/2018, 9:12 AM

## 2018-09-24 LAB — CULTURE, BLOOD (ROUTINE X 2): Culture: NO GROWTH

## 2018-10-01 ENCOUNTER — Non-Acute Institutional Stay: Payer: Medicare HMO | Admitting: Internal Medicine

## 2018-10-01 DIAGNOSIS — G301 Alzheimer's disease with late onset: Secondary | ICD-10-CM

## 2018-10-01 DIAGNOSIS — F0280 Dementia in other diseases classified elsewhere without behavioral disturbance: Secondary | ICD-10-CM

## 2018-10-01 DIAGNOSIS — Z515 Encounter for palliative care: Secondary | ICD-10-CM

## 2018-10-01 DIAGNOSIS — I10 Essential (primary) hypertension: Secondary | ICD-10-CM

## 2018-10-01 NOTE — Progress Notes (Addendum)
Community Pall[facility] Telephone: [phone] Fax: [phone]   PATIENT NAME: Lynn Reynolds DOB: 03/24/1931 MRN: [id number]   PRIMARY CARE PROVIDER:  Farrel Conners NP ([facility])   REFERRING PROVIDER: Farrel Conners NP [facility]) RESPONSIBLE PARTY:   Tollie Eth (DTR) [phone]    INTERVAL HISTORY: Lynn Reynolds is a 84 y.o.female with history significant for Alzheimer's dementia, gait disturbance, fall with fractured right hip (05/2017), fall with non-displaced L shoulder fracture (humeral neck; conservative management), and FTT. This is a routine Palliative Care follow up visit (last seen 08/30/18). Since last seen, she had an ER eval 09/19/09 for hypotensive syncopal episode r/t dehydration. She was treated with IVFs and Keflex for UTI. Palliative continues to follow for assist with symptom management and goals of care.    ASSESSMENT/RECOMMENDATIONS:  1. Alzheimer dementia with behavioral disturbances: Staff report escalation of morning anxious agitation with personal care, when she can be physically abusive to staff and resistant to care.  Staff report patient occasionally refuses her meds. Private pay sitters stay with patient every evening. Patient is calmer with sitter; minimal agitation noted.  Patient uses her walker with good technique; may need reminding for consistent use. Staff report oral intake is 50% of meals. Her weight is 97lbs which is down 3lbs over the last month but reflects her weight 4 months earlier. At height of 5'3" her BMI is 17.2kg/m2.    -Current regimen: Zoloft 1780m qd, Xanax 0.25 qam, 0.593mqhs, 0.80m98md prn (receiveing consistent prn dosing late in the morning or early afternoon). Total Xanax is 1.80mg22my. Also receiving Ativan 0.280mg58mat 2pm.  Equilivant dosing of total short acting Benzos (Ativan 0.280mg 43m Xanax 1.80mg) i47mlonazepam 0.8mg.  C24mider initiation of Clonazepam 0.80mg bid,36mth Ativan 0.280mg prn 81makthrough anxiety.   -Patient is on Keppra for ? h/o seizures. Patient's daughter states patient was initiated on Keppra (750mg bid) 59mr a single episode TIA vs. seizure activity. Patient has had no recurrence of seizure.  Would consider change of Keppra to Depakote, as more information/literature of successful treatment of aggressive behavior in the demented elder, with low dose Depakote sprinkles (1280mg bid to83mrt). Would not aim for therapeutic drug level.    -Since aggressive behavior is exacerbated by attempts with morning personal care, and because patient is calmer and more corporative in the evening with her one-on-one sitters, will change shower schedule from 3 times/wk to once a wk and prn, and change showers to evening schedule. Discussed with daughter and nsg staff, who are agreeable.   2. Recurrent UTI's: Most recent UTI 2 weeks earlier treated with Keflex; completed 1week course. Patient episodically c/o burning with urination. She has been using OTC wipes which may be irritating to perineal area. Suggested to daughter to have evening sitters assist patient to wash perineal area with warm soapy cloth, rinse well, and apply small amount of Vaseline for moisture barrier. Urology visit scheduled for Jan 21st.   3. HTN: current regimen of Hydralazine 50mg tid, Li68mpril 40mg qd, Norv41m10mg, Coreg 25980mid. Her m30mng BP readings (premedication) over the last 3 weeks have ranged 108/87-152/87, with the average of 110's/70's. She has an ER visit 2 weeks earlier for hypotension 60/40 setting of dehydration/UTI. BUN/Creatine: 19 /1.23, GFR 40. -Consider discontinuing Hydralazine and Lisinopril.  Decrease Coreg to 12.80mg bid. Continu280morvasc. Monitor BP response.   4. Advanced Care directives: Met with [facility] dtr Pam today and completed MOST form (placed on chart).  Patient's nonessential meds have previously been pared down.   5. F/u NP visit in 1-2 months.   I spent 100 minutes  providing this consultation, from 1:30pm to 3:10pm. More than 50% of this time was spent coordinating communication.    CODE STATUS: DNR. MOST details: Limited scope of medical intervention (no intubation, avoid ICU). Determine use of IVFs at the time of need. No feeding tube.    PPS: 40% HOSPICE ELIGIBILITY/DIAGNOSIS: no, as prognosis thought to be greater than 6 months.   PAST MEDICAL HISTORY:      Past Medical History:  Diagnosis Date  . Anxiety    . CVA (cerebral vascular accident) ([place]) 11/10/2016    daughter reports "made the dementia worse" (05/23/2017)  . Dementia ([place])      "moderate" (05/23/2017)  . GERD (gastroesophageal reflux disease)    . Hypertension    . Seizures ([place]) ~ 03/2017    "as a result of the stroker" (05/23/2017)    SOCIAL HX:  Social History    Tobacco Use  . Smoking status: Never Smoker  . Smokeless tobacco: Never Used  Substance Use Topics  . Alcohol use: No      ALLERGIES:       Allergies  Allergen Reactions  . Lactose Intolerance (Gi)        unknown  . Latex        unknown  . Paxil [Paroxetine Hcl]        unknown  . Pollen Extract        unknown     PERTINENT MEDICATIONS:      Outpatient Encounter Medications as of 10/01/2018  Medication Sig  . acetaminophen (TYLENOL) 500 MG tablet Take 500 mg by mouth every 4 (four) hours as needed for mild pain.   Marland Kitchen ALPRAZolam (XANAX) 0.5 MG tablet Take 0.25-0.5 mg by mouth See admin instructions. Take 0.25 mg in the morning and 0.5 mg at bedtime  . ALPRAZolam (XANAX) 0.5 MG tablet Take 0.5 mg by mouth daily as needed for anxiety.   Marland Kitchen alum & mag hydroxide-simeth (MAALOX PLUS) 400-400-40 MG/5ML suspension Take 15 mLs by mouth every 6 (six) hours as needed for indigestion.  Marland Kitchen amLODipine (NORVASC) 10 MG tablet Take 10 mg by mouth daily.  . carvedilol (COREG) 25 MG tablet Take 25 mg by mouth 2 (two) times daily with a meal. Hold if SBP <100 or DBP <60  . famotidine (PEPCID) 40 MG tablet Take 40 mg by  mouth at bedtime.  . hydrALAZINE (APRESOLINE) 50 MG tablet Take 50 mg by mouth 3 (three) times daily.  Marland Kitchen levETIRAcetam (KEPPRA) 100 MG/ML solution Take 750 mg by mouth 2 (two) times daily. 7.5 ml  . lisinopril (PRINIVIL,ZESTRIL) 20 MG tablet Take 40 mg by mouth daily. Two tablets by mouth once a day   . LORazepam (ATIVAN) 0.5 MG tablet Take 0.5 mg by mouth 1 day or 1 dose. 1/2 tab (0.42m) daily at 2pm  . Melatonin 3 MG TABS Take 3 mg by mouth at bedtime.  .Marland Kitchenomeprazole (PRILOSEC) 40 MG capsule Take 40 mg by mouth daily.  .Marland KitchenOVER THE COUNTER MEDICATION Place 2 drops into both ears once a week. Heavy Mineral oil  . senna-docusate (SENOKOT-S) 8.6-50 MG tablet Take 1 tablet by mouth See admin instructions. Every other day as needed.  . sertraline (ZOLOFT) 100 MG tablet Take 150 mg by mouth daily.   . Cranberry 425 MG CAPS Take 425 mg by mouth 2 (two) times daily.  . [  DISCONTINUED] cephALEXin (KEFLEX) 500 MG capsule Take 1 capsule (500 mg total) by mouth 3 (three) times daily.  . [DISCONTINUED] cholecalciferol (VITAMIN D) 1000 units tablet Take 2,000 Units by mouth daily.  . [DISCONTINUED] vitamin B-12 (CYANOCOBALAMIN) 500 MCG tablet Take 500 mcg by mouth daily.     No facility-administered encounter medications on file as of 10/01/2018.       PHYSICAL EXAM:  Patient did not allow me to check her vital signs General: NAD, frail appearing, thin Cardiovascular: regular rate and rhythm Pulmonary: clear ant fields Abdomen: soft, nontender, + bowel sounds Extremities: no edema, no joint deformities Skin: no rashes Neurological: Generalizes weakness. Restless and anxious.   Julianne Handler, NP

## 2018-10-02 ENCOUNTER — Encounter: Payer: Self-pay | Admitting: Internal Medicine

## 2018-10-09 ENCOUNTER — Other Ambulatory Visit: Payer: Self-pay

## 2018-10-09 ENCOUNTER — Emergency Department (HOSPITAL_COMMUNITY): Payer: Medicare HMO

## 2018-10-09 ENCOUNTER — Observation Stay (HOSPITAL_COMMUNITY)
Admission: EM | Admit: 2018-10-09 | Discharge: 2018-10-10 | Disposition: A | Discharge status: Home / Self Care | Payer: Medicare HMO | Attending: Internal Medicine | Admitting: Internal Medicine

## 2018-10-09 DIAGNOSIS — G40909 Epilepsy, unspecified, not intractable, without status epilepticus: Secondary | ICD-10-CM | POA: Diagnosis not present

## 2018-10-09 DIAGNOSIS — E860 Dehydration: Secondary | ICD-10-CM | POA: Diagnosis not present

## 2018-10-09 DIAGNOSIS — R29810 Facial weakness: Secondary | ICD-10-CM | POA: Diagnosis not present

## 2018-10-09 DIAGNOSIS — R109 Unspecified abdominal pain: Secondary | ICD-10-CM | POA: Insufficient documentation

## 2018-10-09 DIAGNOSIS — A419 Sepsis, unspecified organism: Secondary | ICD-10-CM | POA: Diagnosis present

## 2018-10-09 DIAGNOSIS — G9340 Encephalopathy, unspecified: Secondary | ICD-10-CM | POA: Diagnosis not present

## 2018-10-09 DIAGNOSIS — K219 Gastro-esophageal reflux disease without esophagitis: Secondary | ICD-10-CM | POA: Insufficient documentation

## 2018-10-09 DIAGNOSIS — G309 Alzheimer's disease, unspecified: Secondary | ICD-10-CM | POA: Diagnosis not present

## 2018-10-09 DIAGNOSIS — F0280 Dementia in other diseases classified elsewhere without behavioral disturbance: Secondary | ICD-10-CM | POA: Insufficient documentation

## 2018-10-09 DIAGNOSIS — I952 Hypotension due to drugs: Secondary | ICD-10-CM | POA: Diagnosis not present

## 2018-10-09 DIAGNOSIS — F419 Anxiety disorder, unspecified: Secondary | ICD-10-CM | POA: Diagnosis not present

## 2018-10-09 DIAGNOSIS — Z8673 Personal history of transient ischemic attack (TIA), and cerebral infarction without residual deficits: Secondary | ICD-10-CM | POA: Diagnosis not present

## 2018-10-09 DIAGNOSIS — Z91040 Latex allergy status: Secondary | ICD-10-CM | POA: Diagnosis not present

## 2018-10-09 DIAGNOSIS — I10 Essential (primary) hypertension: Secondary | ICD-10-CM | POA: Diagnosis not present

## 2018-10-09 DIAGNOSIS — I959 Hypotension, unspecified: Secondary | ICD-10-CM | POA: Diagnosis not present

## 2018-10-09 LAB — URINALYSIS, ROUTINE W REFLEX MICROSCOPIC
Bilirubin Urine: NEGATIVE
Glucose, UA: NEGATIVE mg/dL
Ketones, ur: NEGATIVE mg/dL
Leukocytes, UA: NEGATIVE
Nitrite: NEGATIVE
Protein, ur: 30 mg/dL — AB
SPECIFIC GRAVITY, URINE: 1.014 (ref 1.005–1.030)
pH: 7 (ref 5.0–8.0)

## 2018-10-09 LAB — CBC
HCT: 38.3 % (ref 36.0–46.0)
Hemoglobin: 12 g/dL (ref 12.0–15.0)
MCH: 31.2 pg (ref 26.0–34.0)
MCHC: 31.3 g/dL (ref 30.0–36.0)
MCV: 99.5 fL (ref 80.0–100.0)
Platelets: 214 10*3/uL (ref 150–400)
RBC: 3.85 MIL/uL — AB (ref 3.87–5.11)
RDW: 14 % (ref 11.5–15.5)
WBC: 2.3 10*3/uL — ABNORMAL LOW (ref 4.0–10.5)
nRBC: 0 % (ref 0.0–0.2)

## 2018-10-09 LAB — I-STAT CHEM 8, ED
BUN: 17 mg/dL (ref 8–23)
Calcium, Ion: 1.14 mmol/L — ABNORMAL LOW (ref 1.15–1.40)
Chloride: 104 mmol/L (ref 98–111)
Creatinine, Ser: 0.9 mg/dL (ref 0.44–1.00)
Glucose, Bld: 144 mg/dL — ABNORMAL HIGH (ref 70–99)
HEMATOCRIT: 37 % (ref 36.0–46.0)
Hemoglobin: 12.6 g/dL (ref 12.0–15.0)
Potassium: 4 mmol/L (ref 3.5–5.1)
Sodium: 135 mmol/L (ref 135–145)
TCO2: 23 mmol/L (ref 22–32)

## 2018-10-09 LAB — DIFFERENTIAL
ABS IMMATURE GRANULOCYTES: 0.01 10*3/uL (ref 0.00–0.07)
Basophils Absolute: 0 10*3/uL (ref 0.0–0.1)
Basophils Relative: 0 %
Eosinophils Absolute: 0 10*3/uL (ref 0.0–0.5)
Eosinophils Relative: 0 %
Immature Granulocytes: 0 %
Lymphocytes Relative: 44 %
Lymphs Abs: 1 10*3/uL (ref 0.7–4.0)
Monocytes Absolute: 0.1 10*3/uL (ref 0.1–1.0)
Monocytes Relative: 3 %
Neutro Abs: 1.2 10*3/uL — ABNORMAL LOW (ref 1.7–7.7)
Neutrophils Relative %: 53 %

## 2018-10-09 LAB — COMPREHENSIVE METABOLIC PANEL
ALT: 13 U/L (ref 0–44)
AST: 24 U/L (ref 15–41)
Albumin: 3.6 g/dL (ref 3.5–5.0)
Alkaline Phosphatase: 59 U/L (ref 38–126)
Anion gap: 11 (ref 5–15)
BUN: 16 mg/dL (ref 8–23)
CHLORIDE: 103 mmol/L (ref 98–111)
CO2: 22 mmol/L (ref 22–32)
Calcium: 9.4 mg/dL (ref 8.9–10.3)
Creatinine, Ser: 1.05 mg/dL — ABNORMAL HIGH (ref 0.44–1.00)
GFR calc Af Amer: 56 mL/min — ABNORMAL LOW (ref >60)
GFR calc non Af Amer: 48 mL/min — ABNORMAL LOW (ref >60)
Glucose, Bld: 147 mg/dL — ABNORMAL HIGH (ref 70–99)
Potassium: 4.1 mmol/L (ref 3.5–5.1)
Sodium: 136 mmol/L (ref 135–145)
Total Bilirubin: 0.7 mg/dL (ref 0.3–1.2)
Total Protein: 6.9 g/dL (ref 6.5–8.1)

## 2018-10-09 LAB — POC OCCULT BLOOD, ED: Fecal Occult Bld: NEGATIVE

## 2018-10-09 LAB — I-STAT CG4 LACTIC ACID, ED: Lactic Acid, Venous: 1.01 mmol/L (ref 0.5–1.9)

## 2018-10-09 LAB — CBG MONITORING, ED: Glucose-Capillary: 138 mg/dL — ABNORMAL HIGH (ref 70–99)

## 2018-10-09 LAB — PROTIME-INR
INR: 1.02
Prothrombin Time: 13.3 seconds (ref 11.4–15.2)

## 2018-10-09 LAB — APTT: aPTT: 25 seconds (ref 24–36)

## 2018-10-09 LAB — I-STAT TROPONIN, ED: Troponin i, poc: 0.01 ng/mL (ref 0.00–0.08)

## 2018-10-09 LAB — MRSA PCR SCREENING: MRSA by PCR: NEGATIVE

## 2018-10-09 MED ORDER — VANCOMYCIN HCL IN DEXTROSE 1-5 GM/200ML-% IV SOLN
1000.0000 mg | Freq: Once | INTRAVENOUS | Status: DC
  Administered 2018-10-09: 1000 mg via INTRAVENOUS
  Filled 2018-10-09: qty 200

## 2018-10-09 MED ORDER — SODIUM CHLORIDE 0.9 % IV SOLN: 1.0000 g | INTRAVENOUS | Status: DC

## 2018-10-09 MED ORDER — LEVETIRACETAM 100 MG/ML PO SOLN
750.0000 mg | Freq: Two times a day (BID) | ORAL | Status: DC
  Administered 2018-10-10: 750 mg via ORAL
  Filled 2018-10-09 (×2): qty 7.5

## 2018-10-09 MED ORDER — SODIUM CHLORIDE 0.9 % IV SOLN
INTRAVENOUS | Status: DC
  Administered 2018-10-09 – 2018-10-10 (×2): via INTRAVENOUS

## 2018-10-09 MED ORDER — IOPAMIDOL (ISOVUE-370) INJECTION 76%
50.0000 mL | Freq: Once | INTRAVENOUS | Status: AC | PRN
  Administered 2018-10-09: 50 mL via INTRAVENOUS

## 2018-10-09 MED ORDER — ONDANSETRON HCL 4 MG PO TABS: 4.0000 mg | ORAL_TABLET | Freq: Four times a day (QID) | ORAL | Status: DC | PRN

## 2018-10-09 MED ORDER — SODIUM CHLORIDE 0.9 % IV BOLUS (SEPSIS)
500.0000 mL | Freq: Once | INTRAVENOUS | Status: AC
  Administered 2018-10-09: 500 mL via INTRAVENOUS

## 2018-10-09 MED ORDER — SODIUM CHLORIDE 0.9 % IV BOLUS
1000.0000 mL | Freq: Once | INTRAVENOUS | Status: AC
  Administered 2018-10-09: 1000 mL via INTRAVENOUS

## 2018-10-09 MED ORDER — SODIUM CHLORIDE 0.9 % IV SOLN
2.0000 g | Freq: Once | INTRAVENOUS | Status: AC
  Administered 2018-10-09: 2 g via INTRAVENOUS
  Filled 2018-10-09: qty 2

## 2018-10-09 MED ORDER — SODIUM CHLORIDE 0.9 % IV BOLUS (SEPSIS)
1000.0000 mL | Freq: Once | INTRAVENOUS | Status: AC
  Administered 2018-10-09: 1000 mL via INTRAVENOUS

## 2018-10-09 MED ORDER — ACETAMINOPHEN 650 MG RE SUPP: 650.0000 mg | Freq: Four times a day (QID) | RECTAL | Status: DC | PRN

## 2018-10-09 MED ORDER — ONDANSETRON HCL 4 MG/2ML IJ SOLN: 4.0000 mg | Freq: Four times a day (QID) | INTRAMUSCULAR | Status: DC | PRN

## 2018-10-09 MED ORDER — HEPARIN SODIUM (PORCINE) 5000 UNIT/ML IJ SOLN
5000.0000 [IU] | Freq: Three times a day (TID) | INTRAMUSCULAR | Status: DC
  Administered 2018-10-09 – 2018-10-10 (×2): 5000 [IU] via SUBCUTANEOUS
  Filled 2018-10-09 (×2): qty 1

## 2018-10-09 MED ORDER — ONDANSETRON HCL 4 MG/2ML IJ SOLN
INTRAMUSCULAR | Status: AC
  Filled 2018-10-09: qty 2

## 2018-10-09 MED ORDER — ACETAMINOPHEN 325 MG PO TABS: 650.0000 mg | ORAL_TABLET | Freq: Four times a day (QID) | ORAL | Status: DC | PRN

## 2018-10-09 MED ORDER — METRONIDAZOLE IN NACL 5-0.79 MG/ML-% IV SOLN
500.0000 mg | Freq: Three times a day (TID) | INTRAVENOUS | Status: DC
  Administered 2018-10-09: 500 mg via INTRAVENOUS
  Filled 2018-10-09: qty 100

## 2018-10-09 MED ORDER — VANCOMYCIN HCL IN DEXTROSE 1-5 GM/200ML-% IV SOLN: 1000.0000 mg | INTRAVENOUS | Status: DC

## 2018-10-09 NOTE — Progress Notes (Signed)
New Admission Note:  Arrival Method:via strecher  from ER Mental Orientation: oriented to self Telemetry: bedside monitor  Skin: bilateral bruising  on both hands  IV: L Ac & R AC Pain:0/10 Safety Measures: Safety Fall Prevention Plan was given, discussed. [id number]: Patient has been orientated to the room, unit and the staff. Family: @ bedside   Orders have been reviewed and implemented. Will continue to monitor the patient. Call light has been placed within reach and bed alarm has been activated.   Lawernce IonYari Clella Mckeel ,RN

## 2018-10-09 NOTE — Progress Notes (Signed)
Pharmacy Antibiotic Note  Lynn Reynolds is a 84 y.o. female admitted on 10/09/2018 with sepsis. Pharmacy has been consulted for vancomycin and cefepime dosing. Temperature not yet documented. WBC is low at 2.3 and SCr is WNL at 1.05.   Plan: Vancomycin 1gm IV Q48H Cefepime 2gm IV x 1 then 1gm IV Q24H F/u renal fxn, C&S, clinical status and peak/trough at SS  Weight: 108 lb 11 oz (49.3 kg)  No data recorded.  Recent Labs  Lab 10/09/18 1349 10/09/18 1402  WBC 2.3*  --   CREATININE 1.05* 0.90    Estimated Creatinine Clearance: 35.6 mL/min (by C-G formula based on SCr of 0.9 mg/dL).    Allergies  Allergen Reactions  . Lactose Intolerance (Gi)     unknown  . Latex     unknown  . Paxil [Paroxetine Hcl]     unknown  . Pollen Extract     unknown    Antimicrobials this admission: Vanc 12/31>> Cefepime 12/31>> Flagyl 12/31>>  Dose adjustments this admission: N/A  Microbiology results: Pending  Thank you for allowing pharmacy to be a part of this patient's care.  Darcell Sabino, Drake LeachRachel Lynn 10/09/2018 2:24 PM

## 2018-10-09 NOTE — ED Triage Notes (Signed)
Pt arrives via EMS from [facility] with sudden onsetof facial droop, unresponsive. On EMS arrival pt unable to follow commands, right gaze preference. Bp systolic in 70s. 78G18g RAC and LAC. cbg 169. EKG WNL, hr 70s. PT had active DNR. Attempting to call daughter. Code stroke in progress. Hx prior stroke.

## 2018-10-09 NOTE — Consult Note (Signed)
Neurology Consultation Reason for Consult: Unresponsiveness Referring Physician: Alden ServerJacubowitz, S  CC: Unresponsiveness  History is obtained from: Patient  HPI: Dewayne Daphine DeutscherMartin is a 84 y.o. female history of stroke, seizures who was in her normal state of health earlier today around noon and then subsequently was found to be acutely unresponsive.  On EMS arrival, her systolic blood pressure was 55 and she was unresponsive.  As she began to come around, she seemed confused(though her systolics were still in the 60s at this point).   [facility], she did have a suggestion of a focal exam with a right gaze preference and left-sided weakness, but this improved rapidly with fluids.  Given the precipitant of severely low blood pressure as well as her exam improving, I did not feel that TPA was prudent at this time.  LKW: Noon tpa given?: no, rapidly improving exam  ROS: Unable to obtain due to altered mental status.   Past Medical History:  Diagnosis Date  . Anxiety   . CVA (cerebral vascular accident) (HCC) 11/10/2016   daughter reports "made the dementia worse" (05/23/2017)  . Dementia (HCC)    "moderate" (05/23/2017)  . GERD (gastroesophageal reflux disease)   . Hypertension   . Seizures (HCC) ~ 03/2017   "as a result of the stroker" (05/23/2017)     Family History  Problem Relation Age of Onset  . Diabetes Mother      Social History:  reports that she has never smoked. She has never used smokeless tobacco. She reports that she does not drink alcohol or use drugs.   Exam: Current vital signs: BP (!) 95/54   Pulse 74   Temp (!) 95.1 F (35.1 C) (Rectal)   Resp (!) 25   Wt 49.3 kg   SpO2 100%   BMI 19.25 kg/m  Vital signs in last 24 hours: Temp:  [95.1 F (35.1 C)] 95.1 F (35.1 C) (12/31 1400) Pulse Rate:  [72-79] 74 (12/31 1430) Resp:  [18-25] 25 (12/31 1430) BP: (73-109)/(42-75) 95/54 (12/31 1430) SpO2:  [97 %-100 %] 100 % (12/31 1430) Weight:   [49.3 kg] 49.3 kg (12/31 1300)   Physical Exam  Constitutional: Appears elderly Psych: Appears mildly agitated by everything Eyes: No scleral injection HENT: No OP obstrucion Head: Normocephalic.  Cardiovascular: Normal rate and regular rhythm.  Respiratory: Effort normal, non-labored breathing GI: Soft.  No distension. There is no tenderness.  Skin: WDI  Neuro: Mental Status: Initially, patient is nearly obtunded, but over the course of the examination as she is getting fluids she becomes much more conversant, though she is not oriented. Cranial Nerves: II: Visual Fields are full. Pupils are equal, round, and reactive to light.   III,IV, VI: EOMI without ptosis or diploplia.  There was some suggestion of a right gaze preference on arrival, though she would cross midline even at that time. V: Facial sensation is symmetric to temperature VII: Facial movement is symmetric.  Motor: She moves all extremities equally towards the end of my exam, however initially she appeared to move her left less than her right Sensory: Sensation appeared diminished on the left initially deep Cerebellar: Does not perform   I have reviewed labs in epic and the results pertinent to this consultation are: CMP-unremarkable  I have reviewed the images obtained: CT/CTA- questionable area of attenuation of the vessels on the right which could be chronic or acute  Impression: 84 year old female with syncope in the setting of severely low blood pressure.  I think that her blood pressure was the driver of this event as opposed to a primary neurological event.  Though it is possible that she may have had an ischemic embolic stroke, I would favor doing an MRI to assess for this.  I do not think there is adequate reason to suspect that this was a seizure to modify her home levetiracetam 750 twice daily  Recommendations: 1) MRI brain 2) stroke work-up only if this is positive 3) continue home dose of  levetiracetam 4) severe hypotension for work-up for EM/IM   Ritta SlotMcNeill Honor Frison, MD [facility] [phone]  If 7pm- 7am, please page neurology on call as listed in AMION.

## 2018-10-09 NOTE — ED Provider Notes (Signed)
[facility]ENC[facility] Provider Note   CSN: [id number] Arrival date & time: 10/09/18  1310   An emergency department physician performed an initial assessment on this suspected stroke patient at 1312.  History   Chief Complaint Chief Complaint  Patient presents with  . Code Stroke  Level 5 caveat dementia seen on arrival.  History from paramedics.  EMS was called code nursing facility as patient became unresponsive and was noted to have facial droop.  Code stroke called in the field.  CBG obtained by EMS was 169.  EMS treated patient with nonrebreather mask.  They report that she was breathing a 12 times per minute.  HPI Lynn Reynolds is a 84 y.o. female.  HPI  Past Medical History:  Diagnosis Date  . Anxiety   . CVA (cerebral vascular accident) (HCC) 11/10/2016   daughter reports "made the dementia worse" (05/23/2017)  . Dementia (HCC)    "moderate" (05/23/2017)  . GERD (gastroesophageal reflux disease)   . Hypertension   . Seizures (HCC) ~ 03/2017   "as a result of the stroker" (05/23/2017)    Patient Active Problem List   Diagnosis Date Noted  . Essential hypertension 05/22/2017  . Seizures (HCC) 05/22/2017  . Alzheimer's type dementia with late onset without behavioral disturbance (HCC) 05/22/2017  . Normocytic anemia 05/22/2017  . Hypokalemia 05/22/2017  . Closed intertrochanteric fracture of hip, right, initial encounter (HCC) 05/21/2017    Past Surgical History:  Procedure Laterality Date  . APPENDECTOMY    . BREAST CYST EXCISION    . FEMUR IM NAIL Right 05/22/2017  . FEMUR IM NAIL Right 05/22/2017   Procedure: INTRAMEDULLARY (IM) NAIL FEMORAL;  Surgeon: Gean Birchwoodowan, Frank, MD;  Location: [facility] OR;  Service: Orthopedics;  Laterality: Right;  . FRACTURE SURGERY    . TONSILLECTOMY       OB History   No obstetric history on file.      Home Medications    Prior to Admission medications   Medication Sig Start Date End Date Taking?  Authorizing Provider  acetaminophen (TYLENOL) 500 MG tablet Take 500 mg by mouth every 4 (four) hours as needed for mild pain.     [provider]  ALPRAZolam Prudy Feeler(XANAX) 0.5 MG tablet Take 0.25-0.5 mg by mouth See admin instructions. Take 0.25 mg in the morning and 0.5 mg at bedtime    [provider]  ALPRAZolam (XANAX) 0.5 MG tablet Take 0.5 mg by mouth daily as needed for anxiety.     [provider]  alum & mag hydroxide-simeth (MAALOX PLUS) 400-400-40 MG/5ML suspension Take 15 mLs by mouth every 6 (six) hours as needed for indigestion.    [provider]  amLODipine (NORVASC) 10 MG tablet Take 10 mg by mouth daily.    [provider]  carvedilol (COREG) 25 MG tablet Take 25 mg by mouth 2 (two) times daily with a meal. Hold if SBP <100 or DBP <60    [provider]  Cranberry 425 MG CAPS Take 425 mg by mouth 2 (two) times daily.    [provider]  famotidine (PEPCID) 40 MG tablet Take 40 mg by mouth at bedtime.    [provider]  hydrALAZINE (APRESOLINE) 50 MG tablet Take 50 mg by mouth 3 (three) times daily.    [provider]  levETIRAcetam (KEPPRA) 100 MG/ML solution Take 750 mg by mouth 2 (two) times daily. 7.5 ml    [provider]  lisinopril (PRINIVIL,ZESTRIL) 20 MG  tablet Take 40 mg by mouth daily. Two tablets by mouth once a day     [provider]  LORazepam (ATIVAN) 0.5 MG tablet Take 0.5 mg by mouth 1 day or 1 dose. 1/2 tab (0.25mg ) daily at 2pm    [provider]  Melatonin 3 MG TABS Take 3 mg by mouth at bedtime.    [provider]  omeprazole (PRILOSEC) 40 MG capsule Take 40 mg by mouth daily.    [provider]  OVER THE COUNTER MEDICATION Place 2 drops into both ears once a week. Heavy Mineral oil    [provider]  senna-docusate (SENOKOT-S) 8.6-50 MG tablet Take 1 tablet by mouth See admin instructions. Every other day as needed.    [provider]  sertraline (ZOLOFT) 100 MG tablet Take 150 mg by mouth daily.     [provider]    Family History Family History  Problem Relation Age of Onset  . Diabetes Mother     Social History Social History   Tobacco Use  . Smoking status: Never Smoker  . Smokeless tobacco: Never Used  Substance Use Topics  . Alcohol use: No  . Drug use: No     Allergies   Lactose intolerance (gi); Latex; Paxil [paroxetine hcl]; and Pollen extract   Review of Systems Review of Systems  Unable to perform ROS: Dementia     Physical Exam Updated Vital Signs BP (!) 109/52   Pulse 79   Resp 19   Wt 49.3 kg   SpO2 100%   BMI 19.25 kg/m   Physical Exam Vitals signs and nursing note reviewed.  Constitutional:      Appearance: She is well-developed.     Comments:  Somnolent arousable to verbal stimulus  HENT:     Head: Normocephalic and atraumatic.     Comments: No facial asymmetry Eyes:     Conjunctiva/sclera: Conjunctivae normal.     Pupils: Pupils are equal, round, and reactive to light.  Neck:     Musculoskeletal: Neck supple.     Thyroid: No thyromegaly.     Trachea: No tracheal deviation.  Cardiovascular:     Rate and Rhythm: Normal rate and regular rhythm.     Heart sounds: No murmur.  Pulmonary:     Effort: Pulmonary effort is normal.     Breath sounds: Normal breath sounds.  Abdominal:     General: Bowel sounds are normal. There is no distension.     Palpations: Abdomen is soft.     Tenderness: There is no abdominal tenderness.  Musculoskeletal: Normal range of motion.        General: No tenderness.  Skin:    General: Skin is warm and dry.     Findings: No rash.  Neurological:     Coordination: Coordination normal.     Comments: Moves all extremities cranial nerves II through XII grossly intact.      ED Treatments / Results  Labs (all labs ordered are listed, but only abnormal results are displayed) Labs Reviewed  CBC - Abnormal;  Notable for the following components:      Result Value   WBC 2.3 (*)    RBC 3.85 (*)    All other components within normal limits  DIFFERENTIAL - Abnormal; Notable for the following components:   Neutro Abs 1.2 (*)    All other components within normal limits  COMPREHENSIVE METABOLIC PANEL - Abnormal; Notable for the following components:   Glucose,  Bld 147 (*)    Creatinine, Ser 1.05 (*)    GFR calc non Af Amer 48 (*)    GFR calc Af Amer 56 (*)    All other components within normal limits  I-STAT CHEM 8, ED - Abnormal; Notable for the following components:   Glucose, Bld 144 (*)    Calcium, Ion 1.14 (*)    All other components within normal limits  CULTURE, BLOOD (ROUTINE X 2)  CULTURE, BLOOD (ROUTINE X 2)  PROTIME-INR  APTT  URINALYSIS, ROUTINE W REFLEX MICROSCOPIC  I-STAT TROPONIN, ED  CBG MONITORING, ED  POC OCCULT BLOOD, ED  I-STAT CG4 LACTIC ACID, ED  POC OCCULT BLOOD, ED    EKG EKG Interpretation  Date/Time:  Tuesday October 09 2018 13:47:39 EST Ventricular Rate:  78 PR Interval:    QRS Duration: 102 QT Interval:  429 QTC Calculation: 489 R Axis:   110 Text Interpretation:  Sinus rhythm Right axis deviation Low voltage, extremity leads ST elevation, consider inferior injury Borderline prolonged QT interval No significant change since last tracing Confirmed by Doug SouJacubowitz, Sammie Denner [phone]([id number]) on 10/09/2018 1:55:46 PM   Radiology Ct Head Code Stroke Wo Contrast  Result Date: 10/09/2018 CLINICAL DATA:  Code stroke. Stroke, follow-up. Code stroke. Sudden onset of facial droop and lack of responsiveness. Right gaze preference. EXAM: CT HEAD WITHOUT CONTRAST TECHNIQUE: Contiguous axial images were obtained from the base of the skull through the vertex without intravenous contrast. COMPARISON:  CT head without contrast 03/10/2018 FINDINGS: Brain: A remote lacunar infarct is again noted in the right caudate head. Basal ganglia are otherwise intact. No acute cortical infarct is  present. Advanced white matter disease is stable. Atrophy is similar the prior exam. No significant extra-axial collection is present. The brainstem and cerebellum are normal. Vascular: Atherosclerotic calcifications are present. There is no asymmetric hyperdense vessel. Skull: Calvarium is intact. No focal lytic or blastic lesions are present. No significant extra cranial soft tissue abnormalities are present. Sinuses/Orbits: The paranasal sinuses and mastoid air cells are clear. A right lens replacement is present. Globes and orbits are within normal limits. ASPECTS [facility]([place] Stroke Program Early CT Score) - Ganglionic level infarction (caudate, lentiform nuclei, internal capsule, insula, M1-M3 cortex): 7/7 - Supraganglionic infarction (M4-M6 cortex): 3/3 Total score (0-10 with 10 being normal): 10/10 IMPRESSION: 1. No acute intracranial abnormality or significant interval change. 2. Stable atrophy and advanced white matter disease. 3. Stable remote lacunar infarct of the right caudate head. 4. ASPECTS is 10/10 Electronically Signed   By: Marin Robertshristopher  Mattern M.D.   On: 10/09/2018 13:39    Procedures Procedures (including critical care time)  Medications Ordered in ED Medications  ondansetron (ZOFRAN) 4 MG/2ML injection (has no administration in time range)  sodium chloride 0.9 % bolus 1,000 mL (1,000 mLs Intravenous New Bag/Given 10/09/18 1315)  sodium chloride 0.9 % bolus 1,000 mL (has no administration in time range)    And  sodium chloride 0.9 % bolus 500 mL (has no administration in time range)  ceFEPIme (MAXIPIME) 2 g in sodium chloride 0.9 % 100 mL IVPB (has no administration in time range)  metroNIDAZOLE (FLAGYL) IVPB 500 mg (has no administration in time range)  vancomycin (VANCOCIN) IVPB 1000 mg/200 mL premix (has no administration in time range)  iopamidol (ISOVUE-370) 76 % injection 50 mL (50 mLs Intravenous Contrast Given 10/09/18 1349)     Initial Impression / Assessment and Plan /  ED Course  I have reviewed the triage vital signs and the  nursing notes.  Pertinent labs & imaging results that were available during my care of the patient were reviewed by me and considered in my medical decision making (see chart for details).     Patient noted to be hypothermic after rectal temperature taken.  Dr. Amada Jupiter evaluate patient in ED.  Strongly doubt stroke. At 2 PM she is alert and talkative after treatment with intravenous hydration with normal saline.  Code sepsis called by me.  Sirs criteria include blood pressure and temperature.  Source of infection uncertain.  Patient is DO NOT RESUSCITATE CODE STATUS she also has MOST form permits use of intravenous fluids and antibiotics.  IV normal saline bolus ordered at 30 mL/kg due to patient's mean arterial pressure of 52, as well as broad-spectrum IV antibiotics. Chest x-ray viewed by me   4:05 PM patient resting comfortably.  Appears in no distress.  She is alert and talkative Sepsis - Repeat Assessment  Performed at:    405 pm  Vitals     Blood pressure (!) 121/51, pulse 76, temperature (!) 95.1 F (35.1 C), temperature source Rectal, resp. rate 19, weight 49.3 kg, SpO2 98 %.  Heart:     Regular rate and rhythm  Lungs:    CTA  Capillary Refill:   <2 sec  Peripheral Pulse:   Radial pulse palpable  Skin:     Normal Color  Results for orders placed or performed during the hospital encounter of 10/09/18  Blood Culture (routine x 2)  Result Value Ref Range   Specimen Description BLOOD LEFT FOREARM    Special Requests      BOTTLES DRAWN AEROBIC AND ANAEROBIC Blood Culture results may not be optimal due to an inadequate volume of blood received in culture bottles Performed at [facility], [address] [place], [place] [address]    Culture PENDING    Report Status PENDING   Protime-INR  Result Value Ref Range   Prothrombin Time 13.3 11.4 - 15.2 seconds   INR 1.02   APTT  Result Value Ref Range     aPTT 25 24 - 36 seconds  CBC  Result Value Ref Range   WBC 2.3 (L) 4.0 - 10.5 K/uL   RBC 3.85 (L) 3.87 - 5.11 MIL/uL   Hemoglobin 12.0 12.0 - 15.0 g/dL   HCT 28.4 13.2 - 44.0 %   MCV 99.5 80.0 - 100.0 fL   MCH 31.2 26.0 - 34.0 pg   MCHC 31.3 30.0 - 36.0 g/dL   RDW 10.2 72.5 - 36.6 %   Platelets 214 150 - 400 K/uL   nRBC 0.0 0.0 - 0.2 %  Differential  Result Value Ref Range   Neutrophils Relative % 53 %   Neutro Abs 1.2 (L) 1.7 - 7.7 K/uL   Lymphocytes Relative 44 %   Lymphs Abs 1.0 0.7 - 4.0 K/uL   Monocytes Relative 3 %   Monocytes Absolute 0.1 0.1 - 1.0 K/uL   Eosinophils Relative 0 %   Eosinophils Absolute 0.0 0.0 - 0.5 K/uL   Basophils Relative 0 %   Basophils Absolute 0.0 0.0 - 0.1 K/uL   Immature Granulocytes 0 %   Abs Immature Granulocytes 0.01 0.00 - 0.07 K/uL  Comprehensive metabolic panel  Result Value Ref Range   Sodium 136 135 - 145 mmol/L   Potassium 4.1 3.5 - 5.1 mmol/L   Chloride 103 98 - 111 mmol/L   CO2 22 22 - 32 mmol/L   Glucose, Bld 147 (  H) 70 - 99 mg/dL   BUN 16 8 - 23 mg/dL   Creatinine, Ser 0.98 (H) 0.44 - 1.00 mg/dL   Calcium 9.4 8.9 - 11.9 mg/dL   Total Protein 6.9 6.5 - 8.1 g/dL   Albumin 3.6 3.5 - 5.0 g/dL   AST 24 15 - 41 U/L   ALT 13 0 - 44 U/L   Alkaline Phosphatase 59 38 - 126 U/L   Total Bilirubin 0.7 0.3 - 1.2 mg/dL   GFR calc non Af Amer 48 (L) >60 mL/min   GFR calc Af Amer 56 (L) >60 mL/min   Anion gap 11 5 - 15  Urinalysis, Routine w reflex microscopic  Result Value Ref Range   Color, Urine YELLOW YELLOW   APPearance CLEAR CLEAR   Specific Gravity, Urine 1.014 1.005 - 1.030   pH 7.0 5.0 - 8.0   Glucose, UA NEGATIVE NEGATIVE mg/dL   Hgb urine dipstick SMALL (A) NEGATIVE   Bilirubin Urine NEGATIVE NEGATIVE   Ketones, ur NEGATIVE NEGATIVE mg/dL   Protein, ur 30 (A) NEGATIVE mg/dL   Nitrite NEGATIVE NEGATIVE   Leukocytes, UA NEGATIVE NEGATIVE   RBC / HPF 11-20 0 - 5 RBC/hpf   WBC, UA 0-5 0 - 5 WBC/hpf   Bacteria, UA RARE  (A) NONE SEEN   Mucus PRESENT    Amorphous Crystal PRESENT   I-stat troponin, ED  Result Value Ref Range   Troponin i, poc 0.01 0.00 - 0.08 ng/mL   Comment 3          CBG monitoring, ED  Result Value Ref Range   Glucose-Capillary 138 (H) 70 - 99 mg/dL  I-Stat Chem 8, ED  Result Value Ref Range   Sodium 135 135 - 145 mmol/L   Potassium 4.0 3.5 - 5.1 mmol/L   Chloride 104 98 - 111 mmol/L   BUN 17 8 - 23 mg/dL   Creatinine, Ser 1.47 0.44 - 1.00 mg/dL   Glucose, Bld 829 (H) 70 - 99 mg/dL   Calcium, Ion 5.62 (L) 1.15 - 1.40 mmol/L   TCO2 23 22 - 32 mmol/L   Hemoglobin 12.6 12.0 - 15.0 g/dL   HCT 13.0 86.5 - 78.4 %  POC occult blood, ED  Result Value Ref Range   Fecal Occult Bld NEGATIVE NEGATIVE  I-Stat CG4 Lactic Acid, ED  Result Value Ref Range   Lactic Acid, Venous 1.01 0.5 - 1.9 mmol/L   Ct Angio Head W Or Wo Contrast  Result Date: 10/09/2018 CLINICAL DATA:  Hypotension with unresponsive episode and left facial droop. EXAM: CT ANGIOGRAPHY HEAD AND NECK TECHNIQUE: Multidetector CT imaging of the head and neck was performed using the standard protocol during bolus administration of intravenous contrast. Multiplanar CT image reconstructions and MIPs were obtained to evaluate the vascular anatomy. Carotid stenosis measurements (when applicable) are obtained utilizing NASCET criteria, using the distal internal carotid diameter as the denominator. CONTRAST:  50mL ISOVUE-370 IOPAMIDOL (ISOVUE-370) INJECTION 76% COMPARISON:  Head CT from earlier today FINDINGS: CTA NECK FINDINGS Aortic arch: Atheromatous wall thickening of the aorta. There is 3 vessel branching. Right carotid system: Mild calcified plaque at the bifurcation. No stenosis or ulceration. Left carotid system: Mild calcified plaque at the bifurcation without stenosis or ulceration. Vertebral arteries: No proximal subclavian stenosis. Codominant vertebral arteries that are widely patent to the dura. Skeleton: Levo scoliotic  curvature of the cervical spine which could be 6 or positional. Remote left humeral neck fracture with impaction. Diffuse cervical spine degeneration.  Other neck: No evidence of mass or inflammation Upper chest: Patulous upper esophagus, nonspecific on this static exam. The lungs are clear. Review of the MIP images confirms the above findings CTA HEAD FINDINGS Anterior circulation: No large vessel occlusion or proximal flow limiting stenosis. Undulating carotids, M1, and A1 segments attributed atherosclerosis. There is less well filled right M3 and M4 branches with more extensive luminal irregularity which could be atherosclerotic or embolic in origin. Negative for aneurysm. Posterior circulation: Mild atheromatous changes along the vertebral and basilar arteries. There is moderate atherosclerotic luminal irregularity along the right more than left posterior cerebral arteries with up to moderate right P2/3 segment stenosis. The left PCA is fetal type. No branch occlusion. Venous sinuses: Limited venous opacification due to timing. No evident thrombosis. Anatomic variants: As above Delayed phase: Not obtained in the emergent setting Review of the MIP images confirms the above findings IMPRESSION: 1. No emergent large vessel occlusion. 2. Less well filled right M3 and M4 branches which could be asymmetric atherosclerosis or sequela of distal emboli. 3. Atherosclerosis in the neck without flow limiting stenosis. Electronically Signed   By: Marnee Spring M.D.   On: 10/09/2018 14:09   Dg Chest 1 View  Result Date: 10/09/2018 CLINICAL DATA:  Sepsis. Hypotension. EXAM: CHEST  1 VIEW COMPARISON:  09/19/2018 FINDINGS: There is chronic cardiomegaly. Chronic slight elevation of the left hemidiaphragm. Pulmonary vascularity is normal and the lungs are clear. No acute bone abnormality. Old proximal left humeral fracture. IMPRESSION: No acute cardiopulmonary abnormality. Chronic cardiomegaly. Electronically Signed   By:  Francene Boyers M.D.   On: 10/09/2018 14:37   Ct Angio Neck W Or Wo Contrast  Result Date: 10/09/2018 CLINICAL DATA:  Hypotension with unresponsive episode and left facial droop. EXAM: CT ANGIOGRAPHY HEAD AND NECK TECHNIQUE: Multidetector CT imaging of the head and neck was performed using the standard protocol during bolus administration of intravenous contrast. Multiplanar CT image reconstructions and MIPs were obtained to evaluate the vascular anatomy. Carotid stenosis measurements (when applicable) are obtained utilizing NASCET criteria, using the distal internal carotid diameter as the denominator. CONTRAST:  50mL ISOVUE-370 IOPAMIDOL (ISOVUE-370) INJECTION 76% COMPARISON:  Head CT from earlier today FINDINGS: CTA NECK FINDINGS Aortic arch: Atheromatous wall thickening of the aorta. There is 3 vessel branching. Right carotid system: Mild calcified plaque at the bifurcation. No stenosis or ulceration. Left carotid system: Mild calcified plaque at the bifurcation without stenosis or ulceration. Vertebral arteries: No proximal subclavian stenosis. Codominant vertebral arteries that are widely patent to the dura. Skeleton: Levo scoliotic curvature of the cervical spine which could be 6 or positional. Remote left humeral neck fracture with impaction. Diffuse cervical spine degeneration. Other neck: No evidence of mass or inflammation Upper chest: Patulous upper esophagus, nonspecific on this static exam. The lungs are clear. Review of the MIP images confirms the above findings CTA HEAD FINDINGS Anterior circulation: No large vessel occlusion or proximal flow limiting stenosis. Undulating carotids, M1, and A1 segments attributed atherosclerosis. There is less well filled right M3 and M4 branches with more extensive luminal irregularity which could be atherosclerotic or embolic in origin. Negative for aneurysm. Posterior circulation: Mild atheromatous changes along the vertebral and basilar arteries. There is  moderate atherosclerotic luminal irregularity along the right more than left posterior cerebral arteries with up to moderate right P2/3 segment stenosis. The left PCA is fetal type. No branch occlusion. Venous sinuses: Limited venous opacification due to timing. No evident thrombosis. Anatomic variants: As above Delayed phase:  Not obtained in the emergent setting Review of the MIP images confirms the above findings IMPRESSION: 1. No emergent large vessel occlusion. 2. Less well filled right M3 and M4 branches which could be asymmetric atherosclerosis or sequela of distal emboli. 3. Atherosclerosis in the neck without flow limiting stenosis. Electronically Signed   By: Marnee Spring M.D.   On: 10/09/2018 14:09   Dg Chest Port 1 View  Result Date: 09/19/2018 CLINICAL DATA:  Possible subcapital fracture of the proximal left humerus. A left humerus series is recommended. EXAM: PORTABLE CHEST 1 VIEW COMPARISON:  PA and lateral chest x-ray of March 10, 2018 FINDINGS: The lungs are reasonably well inflated. There are slightly increased lung markings at the left base which are new. There's no pleural effusion. The heart is top-normal in size. The pulmonary vascularity is normal. The mediastinum is normal in width. The trachea is midline. The bony thorax exhibits no acute abnormality. There is an abnormal appearance of the left humeral head-neck junction consistent with an impacted fracture. This is more conspicuous than on the shoulder series of June 14, 2018. IMPRESSION: Probable subsegmental atelectasis at the left lung base. No alveolar pneumonia nor pulmonary edema. Electronically Signed   By: David  [place] M.D.   On: 09/19/2018 13:59   Ct Head Code Stroke Wo Contrast  Result Date: 10/09/2018 CLINICAL DATA:  Code stroke. Stroke, follow-up. Code stroke. Sudden onset of facial droop and lack of responsiveness. Right gaze preference. EXAM: CT HEAD WITHOUT CONTRAST TECHNIQUE: Contiguous axial images were  obtained from the base of the skull through the vertex without intravenous contrast. COMPARISON:  CT head without contrast 03/10/2018 FINDINGS: Brain: A remote lacunar infarct is again noted in the right caudate head. Basal ganglia are otherwise intact. No acute cortical infarct is present. Advanced white matter disease is stable. Atrophy is similar the prior exam. No significant extra-axial collection is present. The brainstem and cerebellum are normal. Vascular: Atherosclerotic calcifications are present. There is no asymmetric hyperdense vessel. Skull: Calvarium is intact. No focal lytic or blastic lesions are present. No significant extra cranial soft tissue abnormalities are present. Sinuses/Orbits: The paranasal sinuses and mastoid air cells are clear. A right lens replacement is present. Globes and orbits are within normal limits. ASPECTS [facility] Stroke Program Early CT Score) - Ganglionic level infarction (caudate, lentiform nuclei, internal capsule, insula, M1-M3 cortex): 7/7 - Supraganglionic infarction (M4-M6 cortex): 3/3 Total score (0-10 with 10 being normal): 10/10 IMPRESSION: 1. No acute intracranial abnormality or significant interval change. 2. Stable atrophy and advanced white matter disease. 3. Stable remote lacunar infarct of the right caudate head. 4. ASPECTS is 10/10 Electronically Signed   By: Marin Roberts M.D.   On: 10/09/2018 13:39  Lab work consistent with leukopenia.  Consulted resident physician for internal medicine service who will arrange for overnight stay. Final Clinical Impressions(s) / ED Diagnoses  Diagnosis undifferentiated sepsis CRITICAL CARE Performed by: Doug Sou Total critical care time: 30 minutes Critical care time was exclusive of separately billable procedures and treating other patients. Critical care was necessary to treat or prevent imminent or life-threatening deterioration. Critical care was time spent personally by me on the following  activities: development of treatment plan with patient and/or surrogate as well as nursing, discussions with consultants, evaluation of patient's response to treatment, examination of patient, obtaining history from patient or surrogate, ordering and performing treatments and interventions, ordering and review of laboratory studies, ordering and review of radiographic studies, pulse oximetry and re-evaluation of patient's  condition. Final diagnoses:  None    ED Discharge Orders    None       Doug Sou, MD 10/09/18 [phone]

## 2018-10-09 NOTE — ED Notes (Signed)
Pt had large loose stool.  Pt cleaned

## 2018-10-09 NOTE — Code Documentation (Signed)
84 yo female coming from [facility] where she is in the memory care unit for dementia. Pt had a sudden onset of LOC at 1200 with some weakness noted and staff noted a facial droop. EMS was called and activated a Code Stroke. Upon EMS arrival to the facility, pt's SBP 55. 2 IVs started and patient started getting fluids. Stroke Team met patient upon arrival. BP noted to be 78/41. Pt is arousable, but noted to not be able to follow commands or answer questions. Initial NIHSS 19 due to patient being drowsy, inability to follow commands or answer questions, bilateral arm and leg weakness, right gaze preference, decreased sensory on the left side, dysarthria, and aphasia. CT Head negative for hemorrhage. Continued to receive fluids during CT. Pressure increased to 98/47. Pt is now more awake and alert. Continues to Ask, "Who are you? What are you trying to do?" CTA completed and shows no sign of LVO. Pt is now alert. Does not answer questions or follow commands per baseline. Pt refuses to hold limbs up bilaterally, but equal strength noted. Stroke not suspected. Code Stroke cancelled. Handoff given to [place], Therapist, sports.

## 2018-10-09 NOTE — H&P (Signed)
Date: 10/09/2018               Patient Name:  Lynn Reynolds MRN: [id number]  DOB: October 11, 1930 Age / Sex: 84 y.o., female   PCP: System, Pcp Not In         Medical Service: Internal Medicine Teaching Service         Attending Physician: Dr. Doug SouJacubowitz, Sam, MD    First Contact: Dr. Maryelizabeth KaufmannVogel, Marie Pager: [phone]  Second Contact: Dr. Levora DredgeHelberg, Justin Pager: [phone]       After Hours (After 5p/  First Contact Pager: [phone]  weekends / holidays): Second Contact Pager: [phone]   Chief Complaint: Syncope  History of Present Illness:  Lynn Reynolds is a 84 yo F w/ PMH of advanced dementia, CVA, seizures, HTN and GERD presenting with loss of consciousness. She was examined and evaluated at ED with family friend present. She was observed resting comfortably in bed. On awakening, she was AAOx1 (name only, unable to describe location, believes it is year [id number]) and unable to describe her presenting symptoms but she states she is currently endorsing abdominal pain and needs to go to the bathroom. She states abdominal pain is not worse with defecation but otherwise unable to provide further details. Denies any chest pain, cough, fever, diarrhea. Family friend was unable to provide additional history.  On conversation with staff at her assisted living facility, [facility], she was in her usual state of health until lunch time when she began to endorse acute onset dry heaving, abdominal pain, right sided facial droop and lost consciousness. Denies any muscle contractions, urinary incontinence, salivation or tongue biting. Staff did not check her blood pressure at the time but earlier that morning her bp was 140/80 and she received her antihypertensive medications as prescribed.  Per chart review, she recently had an ED visit for similar symptoms of syncope and was evaluated in the ED. Cause of her syncopal episode was though to be due to dehydration and she was discharged back to ALF. She was also seen  by palliative nurse recently where a MOST form was filled out requesting DNR, DNI, no ICU and no feeding tube but okay with iv fluids. She was also recommended to stop her antihypertensive medications due to her history of hypotension.  In the ED, her syncope was though to be related to a stroke and code stroke was called. Neurology was consulted and CT head showed no acute bleed. CTA head and neck showed no large vessel occlusion. MRI was ordered. She was also found to be hypotensive with elevated lactate and leukopenia and was started on vanc, flagyl, cefepime.   Meds:  Current Meds  Medication Sig  . acetaminophen (TYLENOL) 500 MG tablet Take 500 mg by mouth every 4 (four) hours as needed for mild pain.   Marland Kitchen. ALPRAZolam (XANAX) 0.5 MG tablet Take 0.25-0.5 mg by mouth See admin instructions. Take 0.25 mg by mouth in the morning, 0.5 mg at bedtime, and 0.5 mg once a day as needed for anxiety  . amLODipine (NORVASC) 10 MG tablet Take 10 mg by mouth daily.  . Calcium Carbonate Antacid (MAALOX PO) Take 15 mLs by mouth 4 (four) times daily as needed (FOR INDIGESTION).  . carvedilol (COREG) 25 MG tablet Take 25 mg by mouth 2 (two) times daily with a meal. Hold if SBP <100 or DBP <60  . Cranberry 425 MG CAPS Take 425 mg by mouth 2 (two) times daily.  . famotidine (PEPCID) 40  MG tablet Take 40 mg by mouth at bedtime.  . hydrALAZINE (APRESOLINE) 50 MG tablet Take 50 mg by mouth 3 (three) times daily.  Marland Kitchen. levETIRAcetam (KEPPRA) 100 MG/ML solution Take 750 mg by mouth 2 (two) times daily.   Marland Kitchen. lisinopril (PRINIVIL,ZESTRIL) 20 MG tablet Take 40 mg by mouth daily.   Marland Kitchen. LORazepam (ATIVAN) 0.5 MG tablet Take 0.25 mg by mouth See admin instructions. Take 0.25 mg by mouth once a day at 2 PM  . Melatonin 3 MG SUBL Place 3 mg under the tongue at bedtime.  . mineral oil external liquid 2 drops See admin instructions. Place 2 drops into each ear once a week  . omeprazole (PRILOSEC) 40 MG capsule Take 40 mg by mouth  daily.  Marland Kitchen. senna-docusate (SENOKOT-S) 8.6-50 MG tablet Take 1 tablet by mouth See admin instructions. Take 1 tablet by mouth every other day as needed for constipation  . sertraline (ZOLOFT) 100 MG tablet Take 150 mg by mouth daily.    Allergies: Allergies as of 10/09/2018 - Review Complete 10/09/2018  Allergen Reaction Noted  . Lactose intolerance (gi) Other (See Comments) 03/10/2018  . Latex Other (See Comments) 05/21/2017  . Paxil [paroxetine hcl] Other (See Comments) 03/10/2018  . Pollen extract Other (See Comments) 03/10/2018   Past Medical History:  Diagnosis Date  . Anxiety   . CVA (cerebral vascular accident) (HCC) 11/10/2016   daughter reports "made the dementia worse" (05/23/2017)  . Dementia (HCC)    "moderate" (05/23/2017)  . GERD (gastroesophageal reflux disease)   . Hypertension   . Seizures (HCC) ~ 03/2017   "as a result of the stroker" (05/23/2017)   Family History:  Family History  Problem Relation Age of Onset  . Diabetes Mother    Social History:  [facility]  Social History   Tobacco Use  . Smoking status: Never Smoker  . Smokeless tobacco: Never Used  Substance Use Topics  . Alcohol use: No  . Drug use: No   Review of Systems: A complete ROS was negative except as per HPI.   Physical Exam: Blood pressure (!) 121/51, pulse 76, temperature (!) 95.1 F (35.1 C), temperature source Rectal, resp. rate 19, weight 49.3 kg, SpO2 98 %.  Gen: Well-developed, well nourished, NAD HEENT: NCAT head, hard of hearing intact , EOMI, PERRL, No nasal discharge, MMM Neck: supple, ROM intact, no JVD, no cervical adenopathy CV: RRR, S1, S2 normal, No rubs, no murmurs, no gallops Pulm: CTAB, No rales, no wheezes  Abd: Soft, BS+, Suprapubic tenderness to palpation without rebound or guarding Extm: ROM intact, Peripheral pulses intact, No peripheral edema Skin: Dry, Warm, normal turgor, no rashes, lesions,  wounds.  GU: soiled adult diaper w/o obvious sign of melena or frank blood. Neuro: AAOx1 to name only, unable to follow directions for neuro exam. Psych: Normal mood and affect  EKG: personally reviewed my interpretation is low amplitude ekg on some leads possibly artifacts, no significant change from prior EKG, no obvious ST elevation or depression.  CXR: personally reviewed my interpretation is elevated left hemi diaphragm, no lobar consolidation, no vascular congestion, no pleural effusions.  Assessment & Plan by Problem: Active Problems:   * No active hospital problems. *  Mrs.Soderquist is a 84 yo F w/ PMH of advanced dementia, CVA, seizures, HTN and GERD presenting with syncope. She meets SIRS criteria with hypotension, hypothermia, and leukopenia. However her white count differential show no bandemia.  She is also at borderline for hypothermia and is on multiple anti-hypertensive with variable bioavailability. No obvious source of infection was found so far. Possibly constellation of symptoms mimicking sepsis but more likely to be medication induced also cannot rule out CVA until MRI. She will require inpatient stay to rule out other causes of her syncopal episode.  Syncope 2/2 CVA vs orthostatic hypotension from dehydration vs medication-induced hypotension Admit bp 73/42. Improved with 2.5L NS. On hydralazine, lisinopril, amlodipine and carvedilol. Facial droop and focal deficit described by nursing staff at ALF. Finishing up stroke work up. WBC 2.3. Lactate on admit 2.47->1.01. Received dose of cef, vanc, flagyl today. - F/u MRI - Appreciate neuro recs - D/c abx for now: no suspicion of infectious source. Hx of recurrent UTIs but negative UA. No Chest X-ray findings. Can restart if worsening symptoms - Maintenance fluid at NS 75cc/hr - Trend CBC - Frequent neuro checks  Hx of Seizure Disorder No seizure activity with syncopal episode per nursing staff - c/w home meds: levetiracetam  750mg  BID  HTN - Holding home antihypertensives  Hx of Dementia On xanax, zoloft, ativan PRN at ALF - Delirium precautions - Holding home anti-anxiety meds for now  DVT prophx: subqheparin Diet: NPO til speech and swallow Code: DNR, DNI  Dispo: Admit patient to Inpatient with expected length of stay greater than 2 midnights.  Signed: Theotis Barrio, MD 10/09/2018, 4:11 PM  Pager: [phone]

## 2018-10-10 DIAGNOSIS — F0390 Unspecified dementia without behavioral disturbance: Secondary | ICD-10-CM

## 2018-10-10 DIAGNOSIS — G9340 Encephalopathy, unspecified: Secondary | ICD-10-CM | POA: Diagnosis present

## 2018-10-10 DIAGNOSIS — I952 Hypotension due to drugs: Secondary | ICD-10-CM

## 2018-10-10 DIAGNOSIS — T465X5A Adverse effect of other antihypertensive drugs, initial encounter: Secondary | ICD-10-CM | POA: Diagnosis not present

## 2018-10-10 DIAGNOSIS — Z888 Allergy status to other drugs, medicaments and biological substances status: Secondary | ICD-10-CM

## 2018-10-10 DIAGNOSIS — Z79899 Other long term (current) drug therapy: Secondary | ICD-10-CM

## 2018-10-10 DIAGNOSIS — Z91048 Other nonmedicinal substance allergy status: Secondary | ICD-10-CM

## 2018-10-10 DIAGNOSIS — R569 Unspecified convulsions: Secondary | ICD-10-CM

## 2018-10-10 DIAGNOSIS — I10 Essential (primary) hypertension: Secondary | ICD-10-CM

## 2018-10-10 DIAGNOSIS — Z8673 Personal history of transient ischemic attack (TIA), and cerebral infarction without residual deficits: Secondary | ICD-10-CM

## 2018-10-10 DIAGNOSIS — K219 Gastro-esophageal reflux disease without esophagitis: Secondary | ICD-10-CM

## 2018-10-10 DIAGNOSIS — E739 Lactose intolerance, unspecified: Secondary | ICD-10-CM

## 2018-10-10 DIAGNOSIS — Z91040 Latex allergy status: Secondary | ICD-10-CM

## 2018-10-10 LAB — CBC
HCT: 30.1 % — ABNORMAL LOW (ref 36.0–46.0)
Hemoglobin: 9.8 g/dL — ABNORMAL LOW (ref 12.0–15.0)
MCH: 31.2 pg (ref 26.0–34.0)
MCHC: 32.6 g/dL (ref 30.0–36.0)
MCV: 95.9 fL (ref 80.0–100.0)
Platelets: 159 10*3/uL (ref 150–400)
RBC: 3.14 MIL/uL — ABNORMAL LOW (ref 3.87–5.11)
RDW: 14 % (ref 11.5–15.5)
WBC: 8.6 10*3/uL (ref 4.0–10.5)
nRBC: 0 % (ref 0.0–0.2)

## 2018-10-10 LAB — BASIC METABOLIC PANEL
Anion gap: 4 — ABNORMAL LOW (ref 5–15)
BUN: 18 mg/dL (ref 8–23)
CO2: 23 mmol/L (ref 22–32)
CREATININE: 0.85 mg/dL (ref 0.44–1.00)
Calcium: 8.1 mg/dL — ABNORMAL LOW (ref 8.9–10.3)
Chloride: 114 mmol/L — ABNORMAL HIGH (ref 98–111)
GFR calc Af Amer: >60 mL/min (ref >60)
GFR calc non Af Amer: >60 mL/min (ref >60)
Glucose, Bld: 143 mg/dL — ABNORMAL HIGH (ref 70–99)
Potassium: 3.3 mmol/L — ABNORMAL LOW (ref 3.5–5.1)
Sodium: 141 mmol/L (ref 135–145)

## 2018-10-10 MED ORDER — POTASSIUM CHLORIDE 10 MEQ/100ML IV SOLN
10.0000 meq | INTRAVENOUS | Status: DC
  Filled 2018-10-10 (×4): qty 100

## 2018-10-10 MED ORDER — POTASSIUM CHLORIDE 10 MEQ/100ML IV SOLN
10.0000 meq | INTRAVENOUS | Status: AC
  Administered 2018-10-10 (×4): 10 meq via INTRAVENOUS
  Filled 2018-10-10 (×4): qty 100

## 2018-10-10 NOTE — Care Management CC44 (Signed)
Condition Code 44 Documentation Completed  Patient Details  Name: Lynn Reynolds MRN: [id number] Date of Birth: [birth year]   Condition Code 44 given:  Yes Patient signature on Condition Code 44 notice:  Yes Documentation of 2 MD's agreement:  Yes Code 44 added to claim:  Yes    Cherrie Distance, RN 10/10/2018, 1:34 PM

## 2018-10-10 NOTE — Progress Notes (Signed)
OT Cancellation Note  Patient Details Name: Lynn Reynolds MRN: [id number] DOB: 08/30/1931   Cancelled Treatment:    Reason Eval/Treat Not Completed: Other (comment)(Order was discontinued as pt was discharged)  Emelda Fear 10/10/2018, 2:21 PM  Sherryl Manges OTR/L Acute Rehabilitation Services Pager: [phone] Office: [phone]

## 2018-10-10 NOTE — NC FL2 (Signed)
[place] MEDICAID FL2 LEVEL OF CARE SCREENING TOOL     IDENTIFICATION  Patient Name: Lynn Reynolds Birthdate: 1931/04/14 Sex: female Admission Date (Current Location): 10/09/2018  [facility] and [place] Number:  Producer, television/film/video and Address:  [facility], [address] [place], [place] [address]      Provider Number: [id number]  Attending Physician Name and Address:  Burns Spain, MD  Relative Name and Phone Number:       Current Level of Care: Other (Comment)(ALF) Recommended Level of Care: Assisted Living Facility Prior Approval Number:    Date Approved/Denied:   PASRR Number:    Discharge Plan: Other (Comment)(ALF)    Current Diagnoses: Patient Active Problem List   Diagnosis Date Noted  . Encephalopathy 10/09/2018  . Essential hypertension 05/22/2017  . Seizures (HCC) 05/22/2017  . Alzheimer's type dementia with late onset without behavioral disturbance (HCC) 05/22/2017  . Normocytic anemia 05/22/2017  . Hypokalemia 05/22/2017  . Closed intertrochanteric fracture of hip, right, initial encounter (HCC) 05/21/2017    Orientation RESPIRATION BLADDER Height & Weight     Self  Normal Incontinent Weight: 108 lb 11 oz (49.3 kg) Height:     BEHAVIORAL SYMPTOMS/MOOD NEUROLOGICAL BOWEL NUTRITION STATUS      Incontinent Diet(regular)  AMBULATORY STATUS COMMUNICATION OF NEEDS Skin   Extensive Assist Verbally Normal                       Personal Care Assistance Level of Assistance  Bathing, Feeding, Dressing Bathing Assistance: Maximum assistance Feeding assistance: Maximum assistance Dressing Assistance: Maximum assistance     Functional Limitations Info  Sight, Hearing, Speech Sight Info: Adequate Hearing Info: Impaired Speech Info: Adequate    SPECIAL CARE FACTORS FREQUENCY                       Contractures Contractures Info: Not present    Additional Factors Info  Code Status, Allergies Code  Status Info: DNR Allergies Info: Lactose Intolerance (Gi), Latex, Paxil Paroxetine Hcl, Pollen Extract           Current Medications (10/10/2018):  This is the current hospital active medication list Current Facility-Administered Medications  Medication Dose Route Frequency Provider Last Rate Last Dose  . 0.9 %  sodium chloride infusion   Intravenous Continuous Levora Dredge, MD 75 mL/hr at 10/10/18 0710    . acetaminophen (TYLENOL) tablet 650 mg  650 mg Oral Q6H PRN Levora Dredge, MD       Or  . acetaminophen (TYLENOL) suppository 650 mg  650 mg Rectal Q6H PRN Levora Dredge, MD      . heparin injection 5,000 Units  5,000 Units Subcutaneous Q8H Levora Dredge, MD   5,000 Units at 10/10/18 0650  . levETIRAcetam (KEPPRA) 100 MG/ML solution 750 mg  750 mg Oral BID Levora Dredge, MD   750 mg at 10/10/18 0908  . ondansetron (ZOFRAN) tablet 4 mg  4 mg Oral Q6H PRN Levora Dredge, MD       Or  . ondansetron (ZOFRAN) injection 4 mg  4 mg Intravenous Q6H PRN Helberg, Justin, MD      . potassium chloride 10 mEq in 100 mL IVPB  10 mEq Intravenous Q1 Hr x 4 Burns Spain, MD 100 mL/hr at 10/10/18 1046 10 mEq at 10/10/18 1046     Discharge Medications: Please see discharge summary for a list of discharge medications.  Relevant Imaging Results:  Relevant  Lab Results:   Additional Information SS#: [id number]  Baldemar LenisElizabeth M Lenville Hibberd, LCSW

## 2018-10-10 NOTE — NC FL2 (Signed)
[place] MEDICAID FL2 LEVEL OF CARE SCREENING TOOL     IDENTIFICATION  Patient Name: Lynn Reynolds Birthdate: 05-29-1931 Sex: female Admission Date (Current Location): 10/09/2018  [facility] and [place] Number:  Producer, television/film/video and Address:  The . [facility], [address], [place], [place] [address]      Provider Number: [id number]  Attending Physician Name and Address:  Burns Spain, MD  Relative Name and Phone Number:       Current Level of Care: Other (Comment)(ALF) Recommended Level of Care: Memory Care Prior Approval Number:    Date Approved/Denied:   PASRR Number:    Discharge Plan: Other (Comment)(Memory Care)    Current Diagnoses: Patient Active Problem List   Diagnosis Date Noted  . Encephalopathy 10/09/2018  . Essential hypertension 05/22/2017  . Seizures (HCC) 05/22/2017  . Alzheimer's type dementia with late onset without behavioral disturbance (HCC) 05/22/2017  . Normocytic anemia 05/22/2017  . Hypokalemia 05/22/2017  . Closed intertrochanteric fracture of hip, right, initial encounter (HCC) 05/21/2017    Orientation RESPIRATION BLADDER Height & Weight     Self  Normal Incontinent Weight: 108 lb 11 oz (49.3 kg) Height:     BEHAVIORAL SYMPTOMS/MOOD NEUROLOGICAL BOWEL NUTRITION STATUS      Incontinent Diet(regular)  AMBULATORY STATUS COMMUNICATION OF NEEDS Skin   Extensive Assist Verbally Normal                       Personal Care Assistance Level of Assistance  Bathing, Feeding, Dressing Bathing Assistance: Maximum assistance Feeding assistance: Maximum assistance Dressing Assistance: Maximum assistance     Functional Limitations Info  Sight, Hearing, Speech Sight Info: Adequate Hearing Info: Impaired Speech Info: Adequate    SPECIAL CARE FACTORS FREQUENCY                       Contractures Contractures Info: Not present    Additional Factors Info  Code Status, Allergies Code Status  Info: DNR Allergies Info: Lactose Intolerance (Gi), Latex, Paxil Paroxetine Hcl, Pollen Extract           Current Medications (10/10/2018):  This is the current hospital active medication list Current Facility-Administered Medications  Medication Dose Route Frequency Provider Last Rate Last Dose  . 0.9 %  sodium chloride infusion   Intravenous Continuous Levora Dredge, MD   Stopped at 10/10/18 1046  . acetaminophen (TYLENOL) tablet 650 mg  650 mg Oral Q6H PRN Levora Dredge, MD       Or  . acetaminophen (TYLENOL) suppository 650 mg  650 mg Rectal Q6H PRN Levora Dredge, MD      . heparin injection 5,000 Units  5,000 Units Subcutaneous Q8H Levora Dredge, MD   5,000 Units at 10/10/18 0650  . levETIRAcetam (KEPPRA) 100 MG/ML solution 750 mg  750 mg Oral BID Levora Dredge, MD   750 mg at 10/10/18 0908  . ondansetron (ZOFRAN) tablet 4 mg  4 mg Oral Q6H PRN Levora Dredge, MD       Or  . ondansetron (ZOFRAN) injection 4 mg  4 mg Intravenous Q6H PRN Helberg, Justin, MD      . potassium chloride 10 mEq in 100 mL IVPB  10 mEq Intravenous Q1 Hr x 4 Burns Spain, MD 100 mL/hr at 10/10/18 1153 10 mEq at 10/10/18 1153     Discharge Medications: Please see discharge summary for a list of discharge medications.  Relevant Imaging Results:  Relevant Lab  Results:   Additional Information SS#: [id number]  Baldemar LenisElizabeth M Janiqua Friscia, LCSW

## 2018-10-10 NOTE — Progress Notes (Signed)
PT Cancellation Note  Patient Details Name: Lynn Reynolds MRN: [id number] DOB: [birth year]   Cancelled Treatment:    Reason Eval/Treat Not Completed: Other (comment)(Order was discontinued as pt was discharged. )   Berline Lopes 10/10/2018, 2:20 PM Melanie Pellot,PT Acute Rehabilitation Services Pager:  [phone]  Office:  [phone]

## 2018-10-10 NOTE — Progress Notes (Signed)
   Subjective: Daughter at bedside this AM. She states that her mom is back to her baseline mental status. She is alert and answers some questions but is not able to tell us her name. We discussed with the daughter that we think this episode is likely secondary to too many blood pressure medications. Discussed that we feel a CVA is unlikely at this point and therefore will not pursue and MRI. Daughter voices understanding and agrees. We will hold BP medication on discharge.   Objective: Vital signs in last 24 hours: Vitals:   10/09/18 2316 10/10/18 0358 10/10/18 0700 10/10/18 0840  BP: (!) 142/58 (!) 136/52 133/74   Pulse: 81 80  79  Resp: 19 (!) 21  19  Temp: 98.2 F (36.8 C) (!) 100.6 F (38.1 C)    TempSrc: Oral Oral    SpO2: 96% 97%  98%  Weight:       Gen: sitting up in bed, alert, not oriented  Cardiac: RRR, no m/r/g Pulm: CTAB Abd: soft, NT, +BS  Assessment/Plan:  Active Problems:   Encephalopathy  Lynn Reynolds is a 85 yo F w/ PMH of advanced dementia, CVA, seizures, HTN and GERD presenting with syncope most likely 2/2 anti-hypertensives  Syncope: Patient is back to baseline today after BP has improved. No focal neuro deficits appreciated but exam is limited by patient cooperation. She is eating and drinking without difficulty. Slight fever overnight of 100.67F but no other signs or symptoms of systemic infection. CXR and UA were unconvincing for infection. Leukopenia has resolved. Hemoglobin dropped to 9.8 but this is her baseline.  - discontinue MRI - hold anti-hypertensives   Dispo: Anticipated discharge today   Ali Lowe, MD 10/10/2018, 10:33 AM [phone]

## 2018-10-10 NOTE — Evaluation (Signed)
Clinical/Bedside Swallow Evaluation Patient Details  Name: Lynn Reynolds MRN: [id number] Date of Birth: [birth year]  Today's Date: 10/10/2018 Time: SLP Start Time (ACUTE ONLY): 0840 SLP Stop Time (ACUTE ONLY): 0853 SLP Time Calculation (min) (ACUTE ONLY): 13 min  Past Medical History:  Past Medical History:  Diagnosis Date  . Anxiety   . CVA (cerebral vascular accident) (HCC) 11/10/2016   daughter reports "made the dementia worse" (05/23/2017)  . Dementia (HCC)    "moderate" (05/23/2017)  . GERD (gastroesophageal reflux disease)   . Hypertension   . Seizures (HCC) ~ 03/2017   "as a result of the stroker" (05/23/2017)   Past Surgical History:  Past Surgical History:  Procedure Laterality Date  . APPENDECTOMY    . BREAST CYST EXCISION    . FEMUR IM NAIL Right 05/22/2017  . FEMUR IM NAIL Right 05/22/2017   Procedure: INTRAMEDULLARY (IM) NAIL FEMORAL;  Surgeon: Gean Birchwood, MD;  Location: [facility] OR;  Service: Orthopedics;  Laterality: Right;  . FRACTURE SURGERY    . TONSILLECTOMY     HPI:  Lynn Reynolds is a 85 yo F w/ PMH of advanced dementia, CVA, seizures, HTN and GERD presenting with syncope   Assessment / Plan / Recommendation Clinical Impression  Pt demonstrates significant dementia with behaviors making her resistant to PO intake at times, prone to oral holding. Daughter was able to encourage pt to take sips and bite of puree. Subjectively swallow appeared WNL, though there were instances of pt sipping without really attending with an immediate cough result. Function is encouraging for adequate protective mechanisms. Pt requires meds crushed in puree at baseline. Will resume baseline diet and sign off.  SLP Visit Diagnosis: Dysphagia, oral phase (R13.11)    Aspiration Risk  Mild aspiration risk    Diet Recommendation Regular;Thin liquid   Liquid Administration via: Cup;Straw Medication Administration: Crushed with puree Supervision: Staff to assist with self feeding;Full  supervision/cueing for compensatory strategies Compensations: Slow rate;Small sips/bites;Minimize environmental distractions Postural Changes: Seated upright at 90 degrees    Other  Recommendations Oral Care Recommendations: Oral care BID   Follow up Recommendations None;Skilled Nursing facility      Frequency and Duration            Prognosis        Swallow Study   General HPI: Lynn Reynolds is a 85 yo F w/ PMH of advanced dementia, CVA, seizures, HTN and GERD presenting with syncope Type of Study: Bedside Swallow Evaluation Diet Prior to this Study: NPO Temperature Spikes Noted: No Respiratory Status: Room air History of Recent Intubation: No Behavior/Cognition: Alert;Cooperative;Pleasant mood Oral Cavity Assessment: Within Functional Limits Oral Care Completed by SLP: No Oral Cavity - Dentition: Adequate natural dentition Vision: Functional for self-feeding Self-Feeding Abilities: Able to feed self;Needs assist Patient Positioning: Upright in bed Baseline Vocal Quality: Normal Volitional Cough: Strong Volitional Swallow: Able to elicit    Oral/Motor/Sensory Function     Ice Chips Ice chips: Not tested   Thin Liquid Thin Liquid: Within functional limits Presentation: Cup;Straw    Nectar Thick Nectar Thick Liquid: Not tested   Honey Thick Honey Thick Liquid: Not tested   Puree Puree: Within functional limits Presentation: Spoon   Solid     Solid: Not tested     Harlon Ditty, MA CCC-SLP  Acute Rehabilitation Services Pager [phone] Office [phone]  Claudine Mouton 10/10/2018,9:06 AM

## 2018-10-10 NOTE — Progress Notes (Signed)
Dc instructions given to pt's dtr at this time.  Daughter verbalized understanding.  Pt has no s/s of any acute distress.  IVs dc'd.  Tele dc'd.

## 2018-10-10 NOTE — Clinical Social Work Note (Signed)
Clinical Social Work Assessment  Patient Details  Name: Lynn Reynolds MRN: [id number] Date of Birth: 11-Nov-1930  Date of referral:  10/10/18               Reason for consult:  Facility Placement                Permission sought to share information with:  Facility Medical sales representative, Family Supports Permission granted to share information::  Yes, Verbal Permission Granted  Name::     [employer]  Agency::  [facility]  Relationship::  Daughter  Solicitor Information:     Housing/Transportation Living arrangements for the past 2 months:  Assisted Dealer of Information:  Medical Team, Adult Children Patient Interpreter Needed:  None Criminal Activity/Legal Involvement Pertinent to Current Situation/Hospitalization:  No - Comment as needed Significant Relationships:  Adult Children Lives with:  Self, Facility Resident Do you feel safe going back to the place where you live?  Yes Need for family participation in patient care:  Yes (Comment)  Care giving concerns:  Patient from [facility] and family reports no concerns about care.   Social Worker assessment / plan:  CSW alerted by MD that patient stable for discharge. CSW alerted facility and family at bedside. CSW sent clinical information to ALF, awaiting call back for family to transport.  Employment status:  Retired Database administrator PT Recommendations:  Not assessed at this time Information / Referral to community resources:     Patient/Family's Response to care:  Patient's family agreeable for patient to return to ALF.  Patient/Family's Understanding of and Emotional Response to Diagnosis, Current Treatment, and Prognosis:  Patient's daughter pleased to hear that patient could return back to her ALF today, awaiting facility to be ready so that she can transport patient back.  Emotional Assessment Appearance:  Appears stated age Attitude/Demeanor/Rapport:  Unable to Assess Affect  (typically observed):  Unable to Assess Orientation:  Oriented to Self Alcohol / Substance use:  Not Applicable Psych involvement (Current and /or in the community):  No (Comment)  Discharge Needs  Concerns to be addressed:  Care Coordination Readmission within the last 30 days:  No Current discharge risk:  None Barriers to Discharge:  No Barriers Identified   Lynn Lenis, LCSW 10/10/2018, 11:21 AM

## 2018-10-10 NOTE — Progress Notes (Signed)
  Date: 10/10/2018  Patient name: Lynn Reynolds  Medical record number: [id number]  Date of birth: Nov 06, 1930   I have seen and evaluated Lynn Reynolds and discussed their care with the Residency Team.  [name] is an 85 year old woman who is a resident of an assisted living facility.  She has dementia so history is obtained from the admitting team and her daughter who is at bedside.  Patient had significantly elevated blood pressure a while back with systolics in the 200s and patient was started on antihypertensives.  She was admitted on amlodipine 10, carvedilol 25, hydralazine 50 3 times daily, and lisinopril 20 mg.  On the day of admission at her assisted living facility, she was in her normal state of health until 1 when she had abdominal pain, dry heaving, right facial drooping, and lost consciousness. Due to the facial droop, she was admitted as a code stroke.  Initial CT head was negative.  She then had vitals performed and was noted to be hypotensive at 73/42.  At that point, sepsis was felt to be likely and she was started on vancomycin, metronidazole, and cefepime. The admitting team's chart review show that she has been seen in the ED for similar symptoms.  A palliative care nurse recommended on her antihypertensives the tapered or stopped.  She was admitted to the internal medicine service for further work-up and evaluation and treatment.  Vitals:   10/10/18 1100 10/10/18 1208  BP:  (!) 138/50  Pulse: 73 72  Resp: 18 18  Temp:  98.4 F (36.9 C)  SpO2: 97% 100%  138/50, T max 100.6 now 98.4 General NAD, pleasantly confused H RRR no murmur rubs or gallops L CTA B  Potassium 4.0 -3.3 Creatinine 0.85 Troponin 0 0.01 Lactic acid 1.10 Hemoglobin 9.8 WBC 2.3 - 8.6 UA negative leukocyte esterase, negative nitrite, 0-5 WBCs, 11-20 RBCs  I personally viewed the CXR images and confirmed my reading with the official read.  AP portable semierect, overpenetrated, rotated.  No overt  infiltrate or abnormality.  I personally viewed the EKG and confirmed my reading with the official read.  Sinus rhythm, normal axis, no ischemic changes  CT head no acute abnormalities but showed a stable remote lacunar infarct in the right caudate head.  CTA no large vessel occlusion  Assessment and Plan: I have seen and evaluated the patient as outlined above. I agree with the formulated Assessment and Plan as detailed in the residents' note, with the following changes: [name] is an 85 year old woman who is a resident of an assisted living facility who has dementia.  She was admitted for symptomatic hypotension which is due to her antihypertensive regimen.  Palliative care has recommended stopping her antihypertensives or tapering them down.  We have stopped her antihypertensives and recommend that her blood pressure be followed as an outpatient rather than resuming her 4 medicines.  Stroke is felt to be unlikely after he got the full history and therefore we have canceled the MRI and echo.  Her daughter at bedside agrees with no further work-up for a stroke.  The ER initially felt that she was septic. She did have one isolated temperature of 100.6 but otherwise we have found no signs or symptoms of an infection.     1.  Syncope secondary to hypotension secondary to antihypertensives -stop her 4 antihypertensives and follow as an outpatient.  Stable to be discharged to home.  Burns Spain, MD 1/1/20202:17 PM

## 2018-10-10 NOTE — Care Management Obs Status (Signed)
MEDICARE OBSERVATION STATUS NOTIFICATION   Patient Details  Name: Lynn Reynolds MRN: [id number] Date of Birth: December 02, 1930   Medicare Observation Status Notification Given:  Yes    Cherrie Distance, RN 10/10/2018, 1:34 PM

## 2018-10-10 NOTE — Progress Notes (Signed)
Report given to Med tech on memory care unit at [facility] where pt will be returning to this afternoon

## 2018-10-10 NOTE — Discharge Summary (Signed)
Name: Lynn Reynolds MRN: [id number] DOB: Mar 02, 1931 85 y.o. PCP: System, Pcp Not In  Date of Admission: 10/09/2018  1:24 PM Date of Discharge: 10/10/2018 Attending Physician: Burns Spain, MD  Discharge Diagnosis: 1. Medication-induced hypotension   Discharge Medications: Allergies as of 10/10/2018      Reactions   Lactose Intolerance (gi) Other (See Comments)   "Allergic," per [facility]   Latex Other (See Comments)   "Allergic," per [facility]   Paxil [paroxetine Hcl] Other (See Comments)   "Allergic," per [facility]   Pollen Extract Other (See Comments)   "Allergic," per [facility]      Medication List    STOP taking these medications   amLODipine 10 MG tablet Commonly known as:  NORVASC   carvedilol 25 MG tablet Commonly known as:  COREG   hydrALAZINE 50 MG tablet Commonly known as:  APRESOLINE   lisinopril 20 MG tablet Commonly known as:  PRINIVIL,ZESTRIL     TAKE these medications   acetaminophen 500 MG tablet Commonly known as:  TYLENOL Take 500 mg by mouth every 4 (four) hours as needed for mild pain.   ALPRAZolam 0.5 MG tablet Commonly known as:  XANAX Take 0.25-0.5 mg by mouth See admin instructions. Take 0.25 mg by mouth in the morning, 0.5 mg at bedtime, and 0.5 mg once a day as needed for anxiety   Cranberry 425 MG Caps Take 425 mg by mouth 2 (two) times daily.   famotidine 40 MG tablet Commonly known as:  PEPCID Take 40 mg by mouth at bedtime.   levETIRAcetam 100 MG/ML solution Commonly known as:  KEPPRA Take 750 mg by mouth 2 (two) times daily.   LORazepam 0.5 MG tablet Commonly known as:  ATIVAN Take 0.25 mg by mouth See admin instructions. Take 0.25 mg by mouth once a day at 2 PM   MAALOX PO Take 15 mLs by mouth 4 (four) times daily as needed (FOR INDIGESTION).   Melatonin 3 MG Subl Place 3 mg under the tongue at bedtime.   mineral oil external liquid 2 drops See admin instructions. Place 2 drops into each ear once a week   omeprazole 40 MG  capsule Commonly known as:  PRILOSEC Take 40 mg by mouth daily.   senna-docusate 8.6-50 MG tablet Commonly known as:  Senokot-S Take 1 tablet by mouth See admin instructions. Take 1 tablet by mouth every other day as needed for constipation   sertraline 100 MG tablet Commonly known as:  ZOLOFT Take 150 mg by mouth daily.       Disposition and follow-up:   LynnSiriyah Reynolds was discharged from [facility] in Stable condition.  At the hospital follow up visit please address:  1.  Medication-induced hypotension:  - Blood pressure recovered to 136/52 from 74/40 with fluid resuscitation and holding home antihypertensive - Please ensure she is off ALL antihypertensive medication. Can resume amlodipine 10mg  daily and/or carvedilol 12.5mg  daily if needed.   2.  Labs / imaging needed at time of follow-up: none  3.  Pending labs/ test needing follow-up: none  Follow-up Appointments:   Hospital Course by problem list: 1. Medication-induced hypotension: LynnReynolds is a 85 yo F w/ PMH of advanced dementia, CVA, Seziures, HTN and GERD presenting with syncope from ALF. Thought to be CVA initially and she underwent CT head and CTA of head and neck which were negative for bleeding or obvious large vessel occlusion. With no focal deficits on exam, neurology thought most likely to be  not CVA. Found to have bp of 73/42 with elevated lactate and was given 2.5L NS bolus with improvement. No obvious infectious etiology with negative UA and chest X-ray. No left shift or bandemia on CBC with diff. Syncope thought to be due to hypotension. Conversation with nursing staff from ALF showed normal bp prior to receiving bp meds. Discharged with recommendation to cease all home anti-hypertensives.  Discharge Vitals:   BP 133/74 (BP Location: Right Arm)   Pulse 79   Temp (!) 100.6 F (38.1 C) (Oral)   Resp 19   Wt 49.3 kg   SpO2 98%   BMI 19.25 kg/m   Pertinent Labs, Studies, and  Procedures:  CBC Latest Ref Rng & Units 10/10/2018 10/09/2018 10/09/2018  WBC 4.0 - 10.5 K/uL 8.6 - 2.3(L)  Hemoglobin 12.0 - 15.0 g/dL 4.3(X) 54.0 08.6  Hematocrit 36.0 - 46.0 % 30.1(L) 37.0 38.3  Platelets 150 - 400 K/uL 159 - 214   BMP Latest Ref Rng & Units 10/10/2018 10/09/2018 10/09/2018  Glucose 70 - 99 mg/dL 761(P) 509(T) 267(T)  BUN 8 - 23 mg/dL 18 17 16   Creatinine 0.44 - 1.00 mg/dL 2.45 8.09 9.83(J)  Sodium 135 - 145 mmol/L 141 135 136  Potassium 3.5 - 5.1 mmol/L 3.3(L) 4.0 4.1  Chloride 98 - 111 mmol/L 114(H) 104 103  CO2 22 - 32 mmol/L 23 - 22  Calcium 8.9 - 10.3 mg/dL 8.1(L) - 9.4    CT Head: 1. No acute intracranial abnormality or significant interval change. 2. Stable atrophy and advanced white matter disease. 3. Stable remote lacunar infarct of the right caudate head. 4. ASPECTS is 10/10  CTA head/neck: 1. No emergent large vessel occlusion. 2. Less well filled right M3 and M4 branches which could be asymmetric atherosclerosis or sequela of distal emboli. 3. Atherosclerosis in the neck without flow limiting stenosis.  CXR: No acute cardiopulmonary abnormality. Chronic cardiomegaly.  Discharge Instructions: Discharge Instructions    Diet - low sodium heart healthy   Complete by:  As directed    Discharge instructions   Complete by:  As directed    Ms. You,  You were admitted to the hospital because your blood pressure got too low. Please stop taking all blood pressure medicines (amlodipine, hydralazine, carvedilol, lisinopril). I'd rather your blood pressure be too high then too low because low blood pressure is much more dangerous given your age. It was a pleasure taking care of you. If you experience headache, vision problems, abdominal pain, or focal weakness please come back to the hospital.   Happy New Year!   Increase activity slowly   Complete by:  As directed       Signed: Ali Lowe, MD 10/10/2018, 10:22 AM   Pager: [phone]

## 2018-10-10 NOTE — Progress Notes (Signed)
Noted blood tinged stool early this am MD on call made aware to continue with the heparin  SQ.

## 2018-10-14 LAB — CULTURE, BLOOD (ROUTINE X 2)
CULTURE: NO GROWTH
Culture: NO GROWTH

## 2018-11-09 ENCOUNTER — Non-Acute Institutional Stay: Payer: Medicare HMO | Admitting: Internal Medicine

## 2018-11-09 ENCOUNTER — Encounter: Payer: Self-pay | Admitting: Internal Medicine

## 2018-11-09 VITALS — BP 164/68 | HR 64 | Resp 16 | Ht 63.0 in | Wt 97.0 lb

## 2018-11-09 DIAGNOSIS — Z515 Encounter for palliative care: Secondary | ICD-10-CM

## 2018-11-09 NOTE — Progress Notes (Signed)
Jan 31st,2020 [facility] Telephone: [phone] Fax: [phone]  PATIENT NAME:Lynn Reynolds DOB:[birth year] VGJ:[id number]  PRIMARY CARE PROVIDER: Georges Lynch NP ([facility])  REFERRING PROVIDER: Georges Lynch NP [facility]) RESPONSIBLE PARTY:Pam Lowell Guitar (DTR) [phone]  ASSESSMENT/RECOMMENDATIONS: 1.Alzheimer dementiawith behavioral disturbances: Staff reportcontinued problems withmorninganxious agitation with personal care,and now whenever she is directed to do simple tasks. She is often physically abusive to staffand even more resistant to care.Private paysitterscontinue to stay with patient everyevening 6pm-10pm. Patient ambulates with her walker but needs reminding for consistent use. Staff report patient needs increased assist with eating, and they now often need to feed her. Her oral intake has declined to 25-75% of meals.Her January weight isreported as 97lbs which is down 3lbs over the last 2 months but reflects her weight 5 months earlier.At height of 5'3" her BMI is 17.2kg/m2.  -Current regimen: Zoloft 150mg  qd, Xanax 0.25 qam, 0.5mg  qhs, 0.5mg  qd prn (receiveing consistentprndosing late in the morning or early afternoon). Total Xanax is 1.5mg /day. Also receiving Ativan 0.25mg  qd at 2pm.  Equilivant dosing of total short acting Benzos (Ativan 0.25mg  plus Xanax 1.5mg ) is Clonazepam 0.8mg .  Consider discontinuation of Xanax. Start Clonazepam 0.5mg  bid, with Ativan 0.25mg  prn breakthrough anxiety.  -Patient is on Keppra for ? h/o seizures. Patient's daughter states patient was initiated on Keppra(750mg  bid)after a single episode TIA vs.seizure activity. Patient has had norecurrence of seizure.Would consider change of Keppra toDepakote, as more information/literature of successful treatment of aggressive behavior in the demented elder, with low dose Depakote sprinkles (125mg  bid to start).  Alternatively, could consider initiation of Zyprexa 2.5 qd, with increase to bid if necessary.  -Discussed above recommendations with cottage [facility] director, who will discuss with Psych ANP who will be rounding in 3 days.   2. Advanced Care directives:DNR. MOST: DNR/DNI. Limited additional medical interventions. Determine use of IVFs and antibiotics at the time of need  3. F/u NP visit in 1-2 months.  HPI / INTERVAL HISTORY:Lynn Reynolds a 85 y.o.femalewithhistory significant forAlzheimer's dementia, gait disturbance, fall with fractured right hip(05/2017),fall with non-displaced L shoulder fracture (humeral neck; conservative management),andFTT.This is a routinePalliative Carefollow up visit (last seen11/21/19). Since last seen, shehad an ER eval 09/19/09 for hypotensive syncopal episode r/t dehydration. She was treated with IVFs and Keflex for UTI. Hospitalization 12/31-10/11/2018 for medication induced hypotension. Palliative continues to follow (last visit 10/01/2018) for assist withsymptom management andgoals of care.  I spent60 minutes providing this consultation, from 11:30amto12:30pm. More than 50% of this time was spent coordinating communication, interviewing staff, and reconciling facility MAR with [facility] EMR..   CODE STATUS:DNR. MOST details: Limited scope of medical intervention (no intubation, avoid ICU). Determine use of IVFs at the time of need. No feeding tube.  PPS:40% HOSPICE ELIGIBILITY/DIAGNOSIS:no, as prognosis thought to be greater than 6 months.  PAST MEDICAL HISTORY:  Past Medical History:  Diagnosis Date  . Anxiety   . CVA (cerebral vascular accident) (HCC) 11/10/2016   daughter reports "made the dementia worse" (05/23/2017)  . Dementia (HCC)    "moderate" (05/23/2017)  . GERD (gastroesophageal reflux disease)   . Hypertension   . Seizures (HCC) ~ 03/2017   "as a result of the stroker" (05/23/2017)    SOCIAL HX:  Social History    Tobacco Use  . Smoking status: Never Smoker  . Smokeless tobacco: Never Used  Substance Use Topics  . Alcohol use: No    ALLERGIES:  Allergies  Allergen Reactions  .  Lactose Intolerance (Gi) Other (See Comments)    "Allergic," per [facility]  . Latex Other (See Comments)    "Allergic," per [facility]  . Paxil [Paroxetine Hcl] Other (See Comments)    "Allergic," per MAR  . Pollen Extract Other (See Comments)    "Allergic," per [facility]     PERTINENT MEDICATIONS:  Outpatient Encounter Medications as of 11/09/2018  Medication Sig  . acetaminophen (TYLENOL) 500 MG tablet Take 500 mg by mouth every 4 (four) hours as needed for mild pain.   Marland Kitchen ALPRAZolam (XANAX) 0.5 MG tablet Take 0.25-0.5 mg by mouth See admin instructions. Take 0.25 mg by mouth in the morning, 0.5 mg at bedtime, and 0.5 mg once a day as needed for anxiety  . carvedilol (COREG) 12.5 MG tablet Take 12.5 mg by mouth 2 (two) times daily with a meal.  . Cranberry 425 MG CAPS Take 425 mg by mouth 2 (two) times daily.  . famotidine (PEPCID) 40 MG tablet Take 40 mg by mouth at bedtime.  . hydrochlorothiazide (MICROZIDE) 12.5 MG capsule Take 12.5 mg by mouth daily.  Marland Kitchen levETIRAcetam (KEPPRA) 100 MG/ML solution Take 750 mg by mouth 2 (two) times daily.   Marland Kitchen LORazepam (ATIVAN) 0.5 MG tablet Take 0.25 mg by mouth See admin instructions. Take 0.25 mg by mouth once a day at 2 PM  . Melatonin 3 MG SUBL Place 3 mg under the tongue at bedtime.  . mineral oil external liquid 2 drops See admin instructions. Place 2 drops into each ear once a week  . omeprazole (PRILOSEC) 40 MG capsule Take 40 mg by mouth daily.  Marland Kitchen senna-docusate (SENOKOT-S) 8.6-50 MG tablet Take 1 tablet by mouth See admin instructions. Take 1 tablet by mouth every other day as needed for constipation  . sertraline (ZOLOFT) 100 MG tablet Take 150 mg by mouth daily.   . [DISCONTINUED] Calcium Carbonate Antacid (MAALOX PO) Take 15 mLs by mouth 4 (four) times daily as needed (FOR  INDIGESTION).   No facility-administered encounter medications on file as of 11/09/2018.     PHYSICAL EXAM: VS: BP 164/68, HR 64, RR 16 Thin elderly Caucasian female sitting in a chair in the common area. She is clam, engaging and confused. Not following simple commands. Orlene Erm is off topic. General: NAD, frail appearing, thin Cardiovascular: regular rate and rhythm Pulmonary: clear ant fields Abdomen: soft, nontender, + bowel sounds Extremities: no edema, no joint deformities Skin: no rashes Neurological:Generalizes weakness. Restless and anxious.  Anselm Lis, NP

## 2019-01-03 IMAGING — CT CT ANGIO NECK
2 of 9 series · 9 of 46 positions shown, 14 images · IV contrast (OMNI)
Comparison: Head CT from earlier today

CLINICAL DATA: Hypotension with unresponsive episode and left
facial droop.

EXAM:
CT ANGIOGRAPHY HEAD AND NECK
TECHNIQUE: Multidetector CT imaging of the head and neck was performed using
the standard protocol during bolus administration of intravenous
contrast. Multiplanar CT image reconstructions and MIPs were
obtained to evaluate the vascular anatomy. Carotid stenosis
measurements (when applicable) are obtained utilizing NASCET
criteria, using the distal internal carotid diameter as the
denominator.
CONTRAST:  50mL SMG44L-UCQ IOPAMIDOL (SMG44L-UCQ) INJECTION 76%

[Series 6: carotid/brain 2.0 i30f 3 · axial · 0.40mm/px · z∈[-345,-73]mm · 7 of 182 slices shown, 12 images]
[im 23/182  soft-tissue]
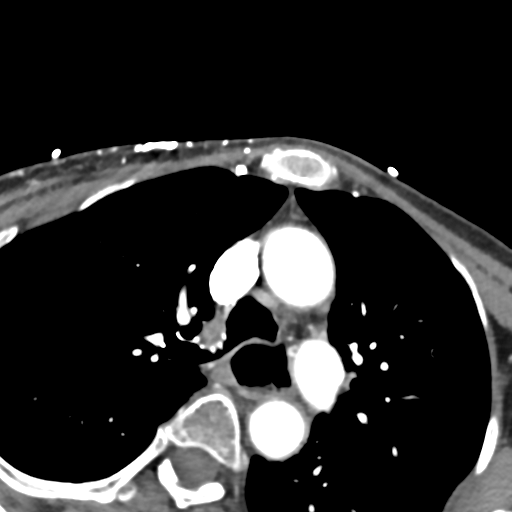
[im 23/182  bone]
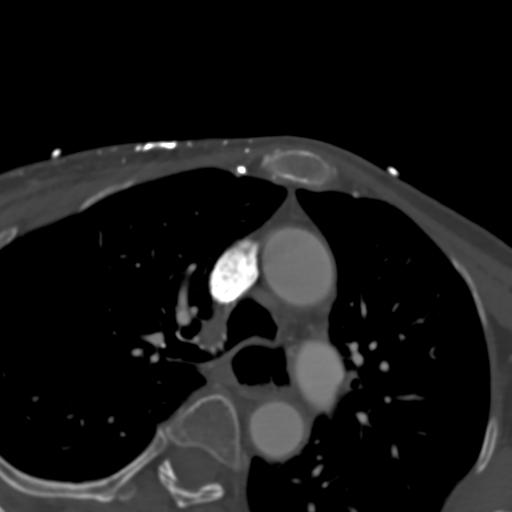
[im 46/182  soft-tissue]
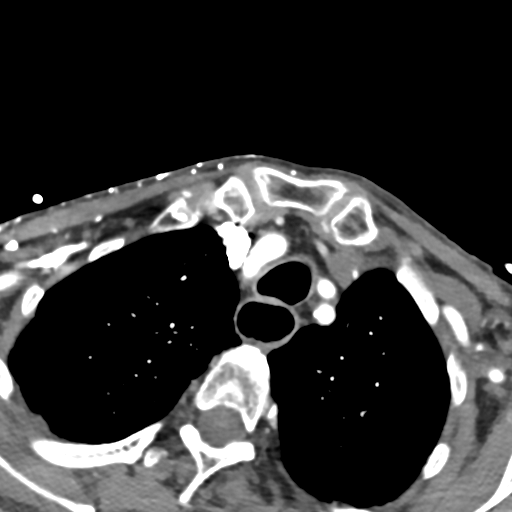
[im 68/182  soft-tissue]
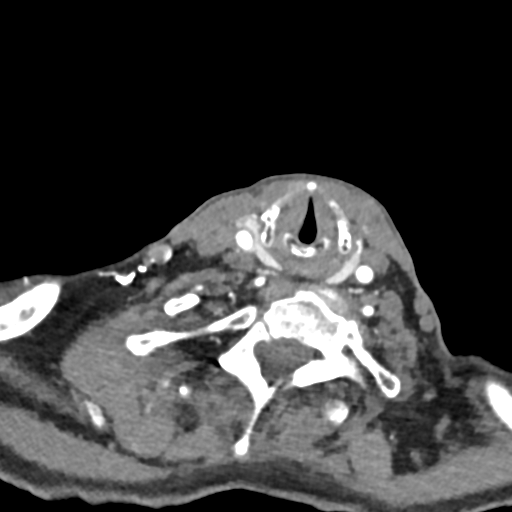
[im 91/182  soft-tissue]
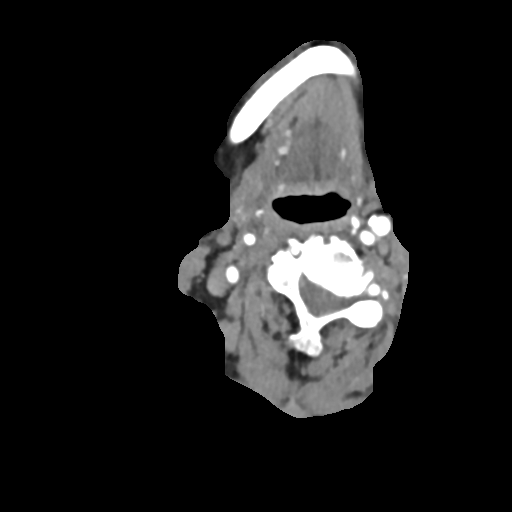
[im 91/182  lung]
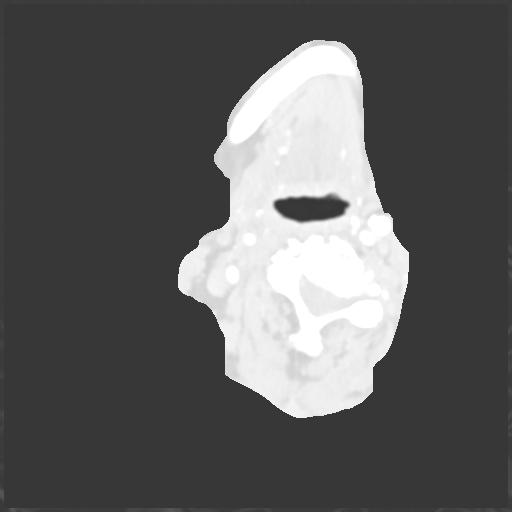
[im 114/182  soft-tissue]
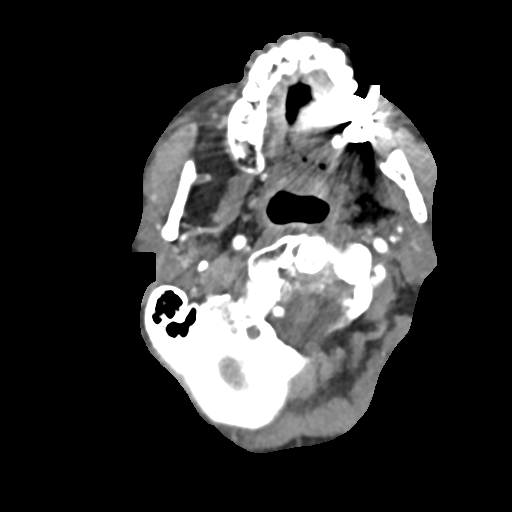
[im 114/182  lung]
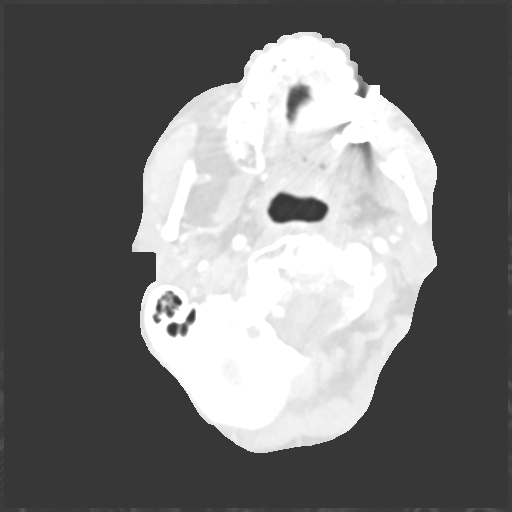
[im 136/182  soft-tissue]
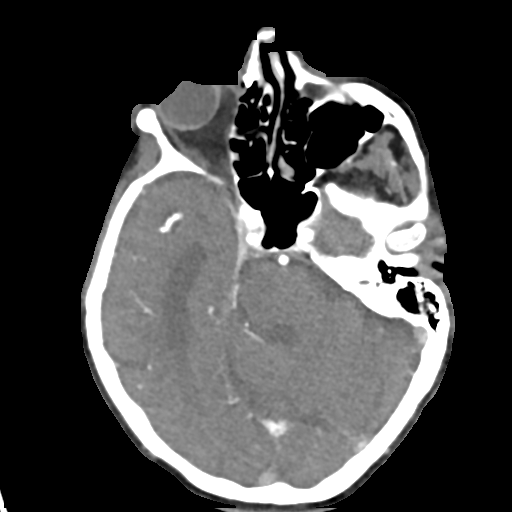
[im 136/182  lung]
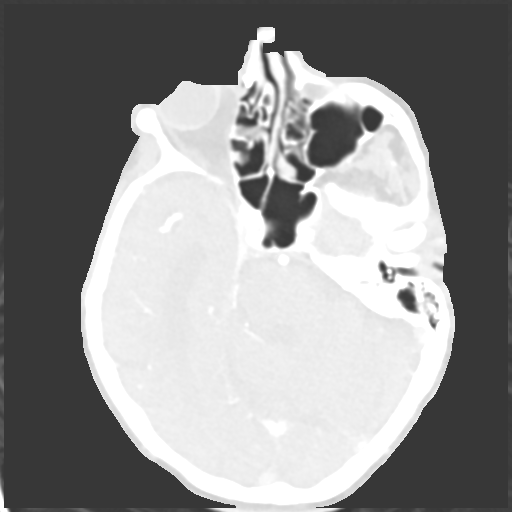
[im 159/182  soft-tissue]
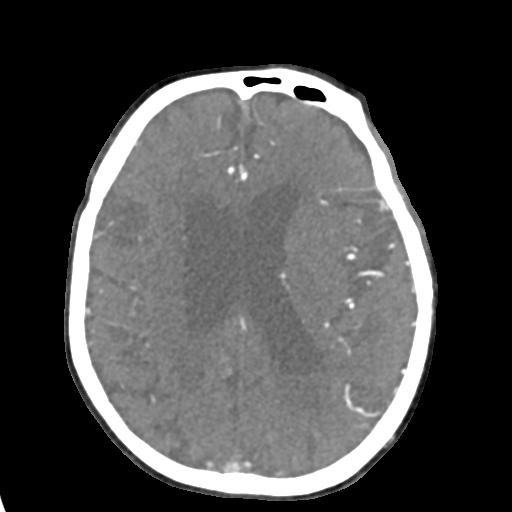
[im 159/182  lung]
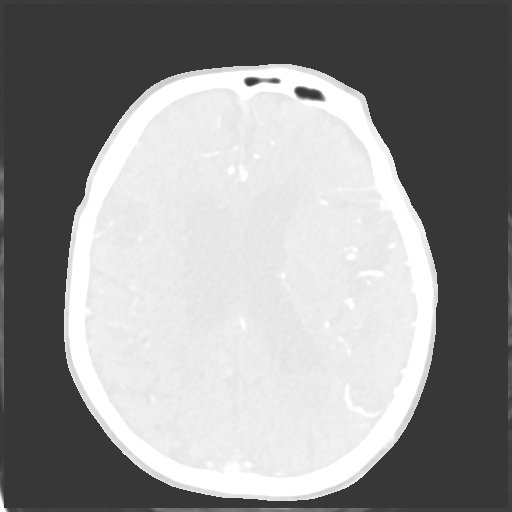

[Series 12: carotid mips (id) · axial · 0.58mm/px · z∈[-252,-137]mm · 2 of 69 slices shown]
[im 23/69  soft-tissue]
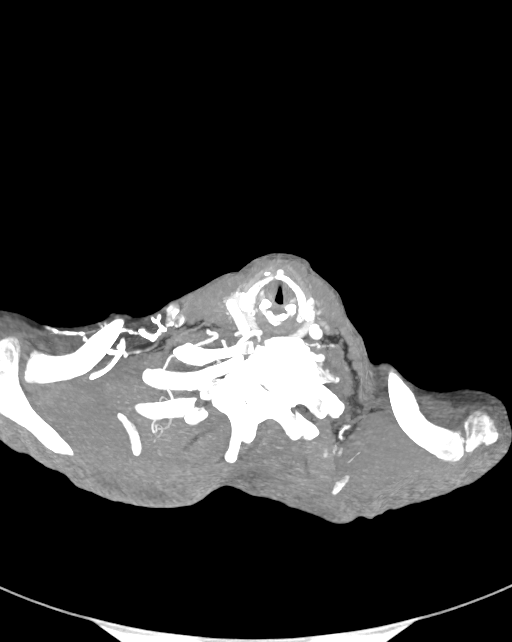
[im 46/69  soft-tissue]
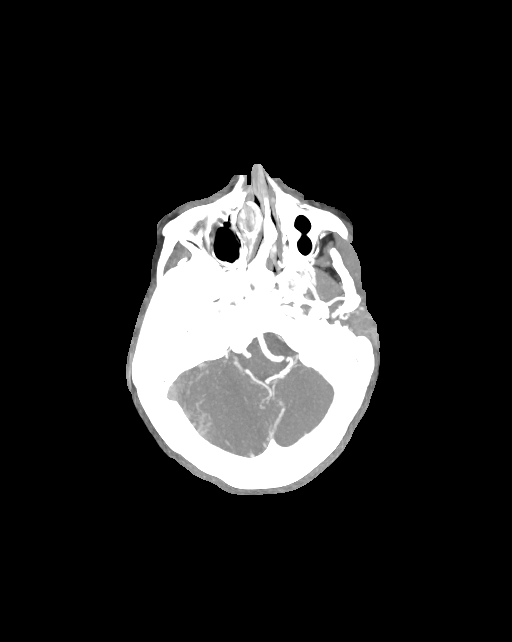

[9 of 46 positions shown; findings below may reference images not displayed]

FINDINGS: CTA NECK FINDINGS

Aortic arch: Atheromatous wall thickening of the aorta. There is 3
vessel branching.

Right carotid system: Mild calcified plaque at the bifurcation. No
stenosis or ulceration.

Left carotid system: Mild calcified plaque at the bifurcation
without stenosis or ulceration.

Vertebral arteries: No proximal subclavian stenosis. Codominant
vertebral arteries that are widely patent to the dura.

Skeleton: Levo scoliotic curvature of the cervical spine which could
be 6 or positional. Remote left humeral neck fracture with
impaction. Diffuse cervical spine degeneration.

Other neck: No evidence of mass or inflammation

Upper chest: Patulous upper esophagus, nonspecific on this static
exam. The lungs are clear.

Review of the MIP images confirms the above findings

CTA HEAD FINDINGS

Anterior circulation: No large vessel occlusion or proximal flow
limiting stenosis. Undulating carotids, M1, and A1 segments
attributed atherosclerosis. There is less well filled right M3 and
M4 branches with more extensive luminal irregularity which could be
atherosclerotic or embolic in origin. Negative for aneurysm.

Posterior circulation: Mild atheromatous changes along the vertebral
and basilar arteries. There is moderate atherosclerotic luminal
irregularity along the right more than left posterior cerebral
arteries with up to moderate right P2/3 segment stenosis. The left
PCA is fetal type. No branch occlusion.

Venous sinuses: Limited venous opacification due to timing. No
evident thrombosis.

Anatomic variants: As above

Delayed phase: Not obtained in the emergent setting

Review of the MIP images confirms the above findings
IMPRESSION: 1. No emergent large vessel occlusion.
2. Less well filled right M3 and M4 branches which could be
asymmetric atherosclerosis or sequela of distal emboli.
3. Atherosclerosis in the neck without flow limiting stenosis.

## 2019-01-03 IMAGING — CT CT HEAD CODE STROKE
3 of 4 series · 14 of 47 positions shown, 16 images · non-contrast
Comparison: CT head without contrast 03/10/2018

CLINICAL DATA: Code stroke. Stroke, follow-up. Code stroke. Sudden
onset of facial droop and lack of responsiveness. Right gaze
preference.

EXAM:
CT HEAD WITHOUT CONTRAST
TECHNIQUE: Contiguous axial images were obtained from the base of the skull
through the vertex without intravenous contrast.

[Series 4: head 2.0 h70h · axial · 0.39mm/px · z∈[-152,-12]mm · 8 of 88 slices shown, 10 images]
[im 9/88  brain]
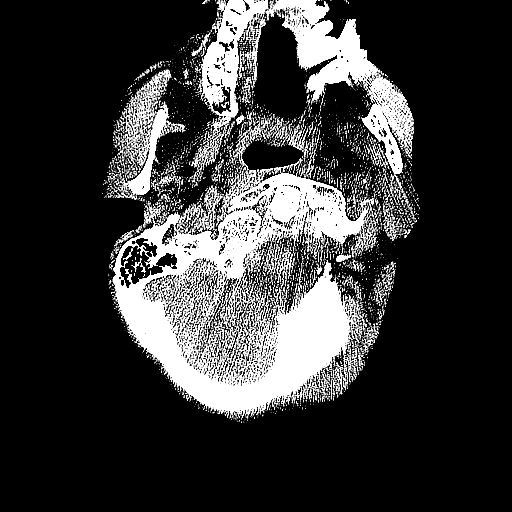
[im 9/88  bone]
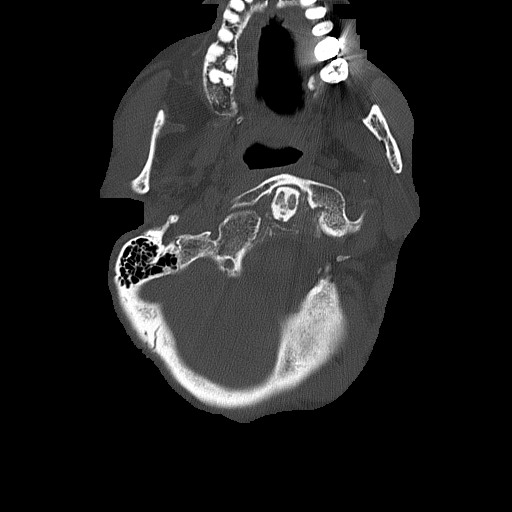
[im 17/88  brain]
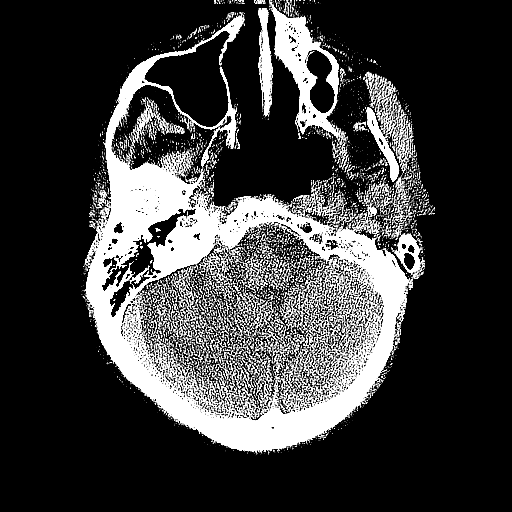
[im 30/88  brain]
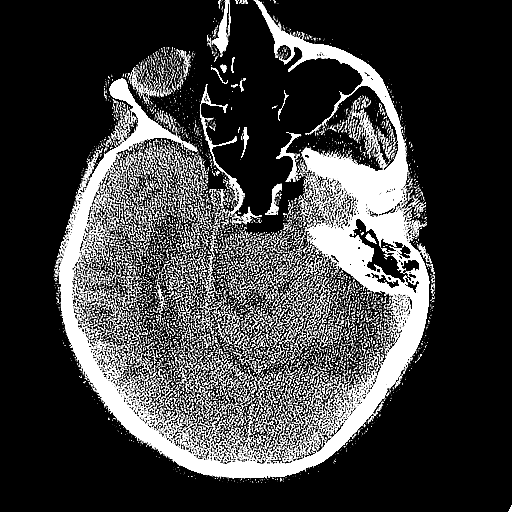
[im 38/88  brain]
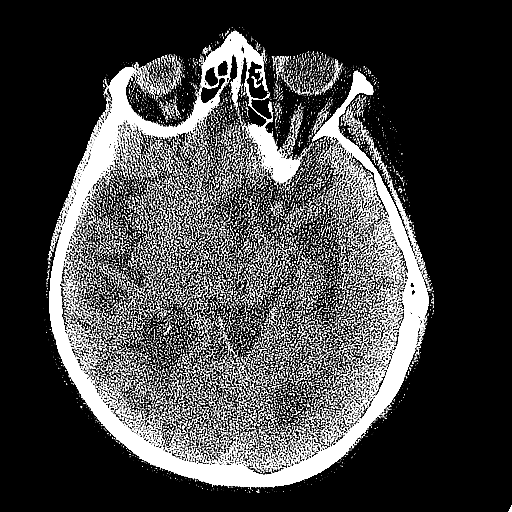
[im 50/88  brain]
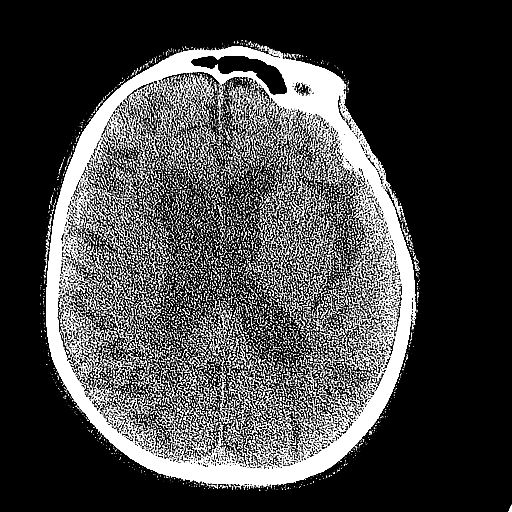
[im 50/88  bone]
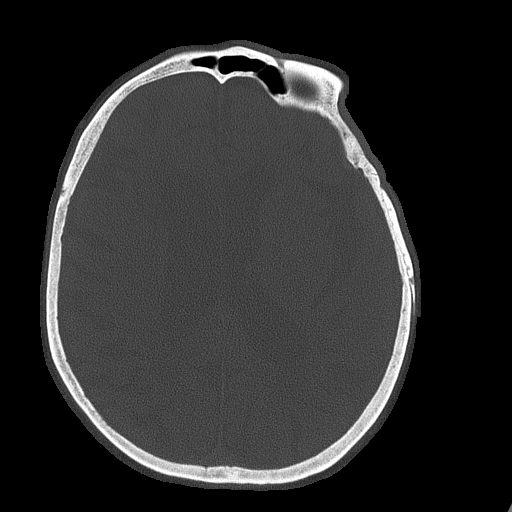
[im 59/88  brain]
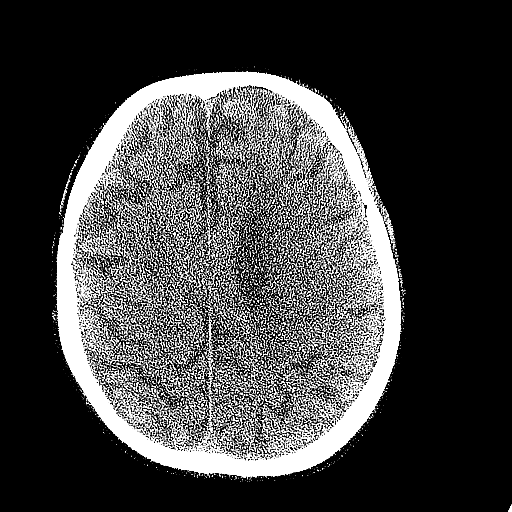
[im 71/88  brain]
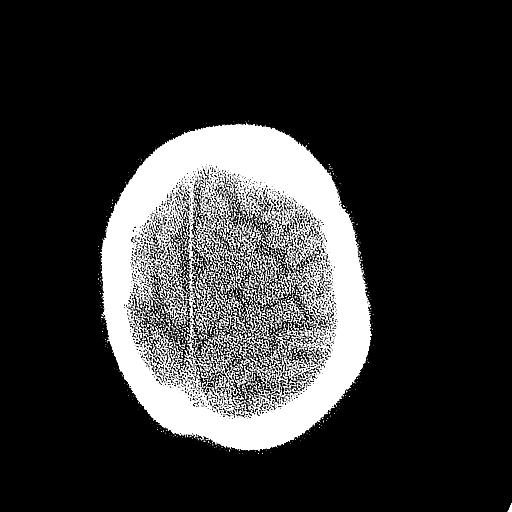
[im 79/88  brain]
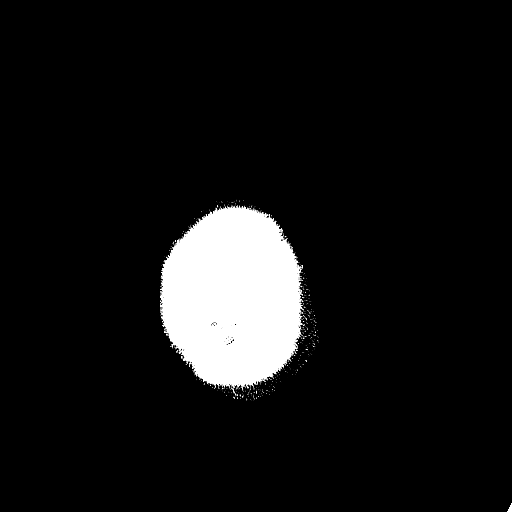

[Series 5: head 3.0 mpr cor · coronal · 0.38mm/px · 3 of 81 slices shown]
[im 27/81  brain]
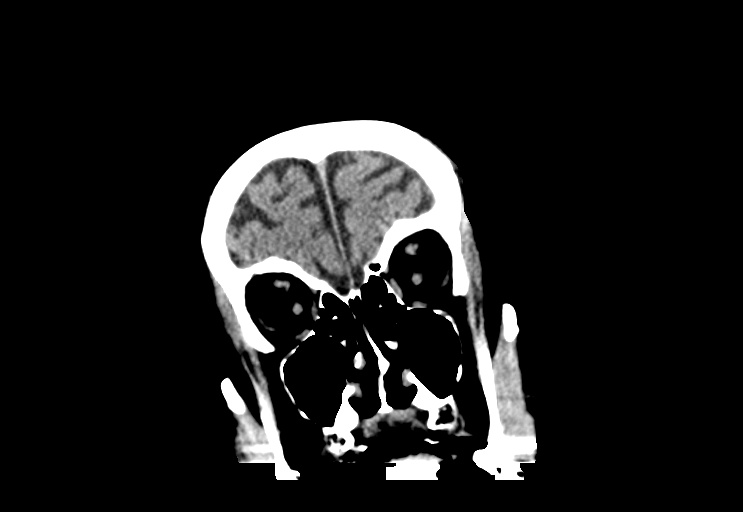
[im 36/81  brain]
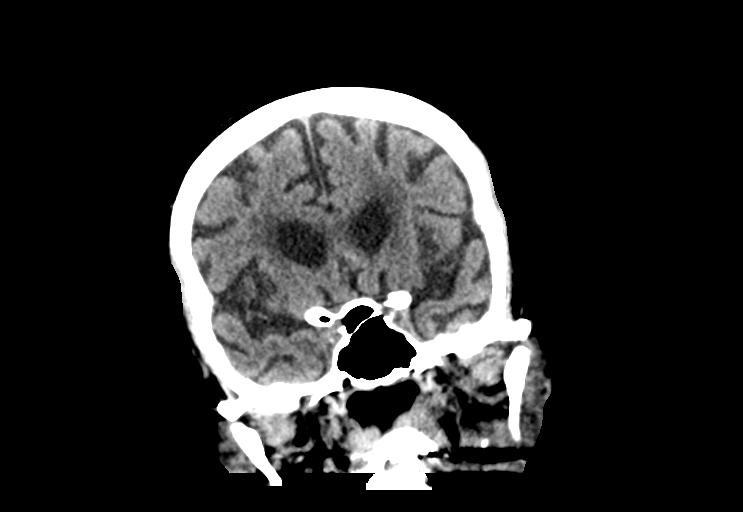
[im 45/81  brain]
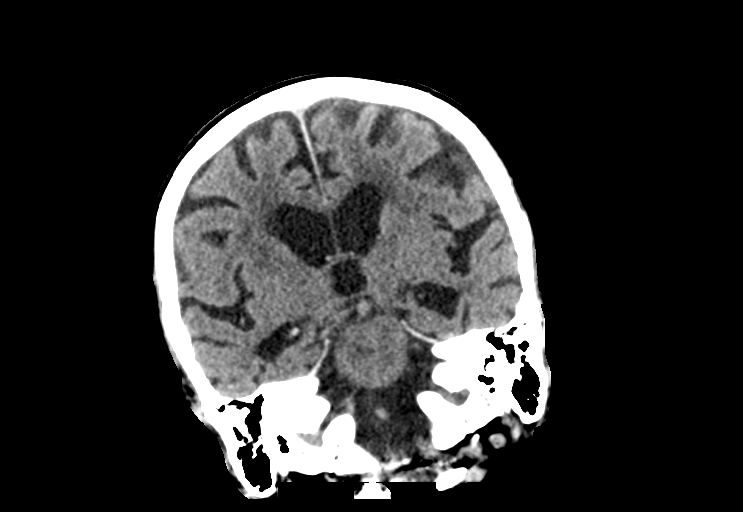

[Series 6: head 3.0 mpr sag · sagittal · 0.41mm/px · 3 of 65 slices shown]
[im 22/65  brain]
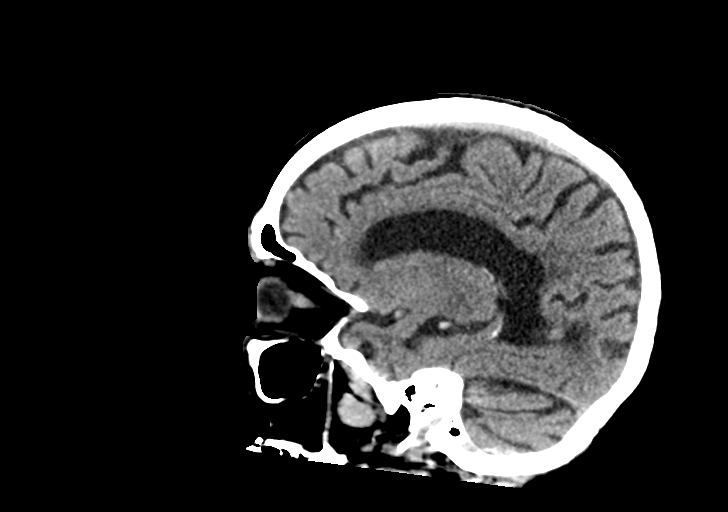
[im 33/65  brain]
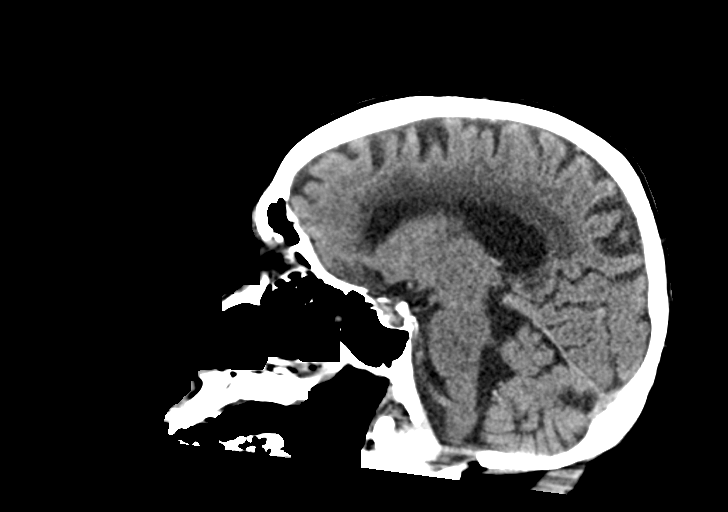
[im 43/65  brain]
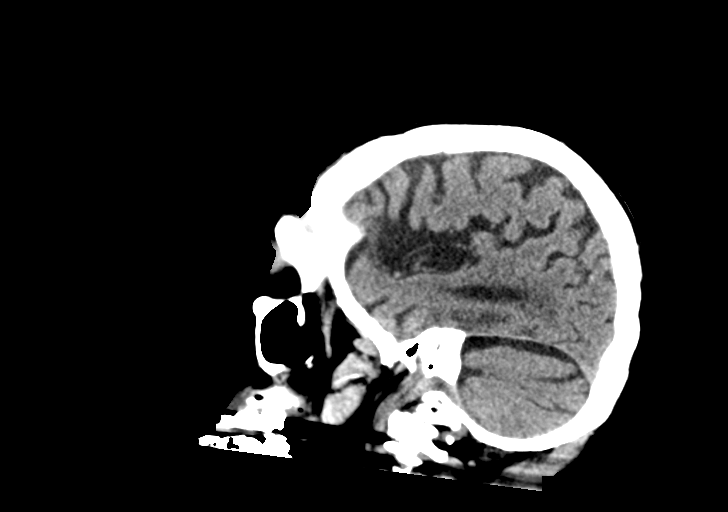

[14 of 47 positions shown; findings below may reference images not displayed]

FINDINGS: Brain: A remote lacunar infarct is again noted in the right caudate
head. Basal ganglia are otherwise intact. No acute cortical infarct
is present. Advanced white matter disease is stable. Atrophy is
similar the prior exam. No significant extra-axial collection is
present. The brainstem and cerebellum are normal.

Vascular: Atherosclerotic calcifications are present. There is no
asymmetric hyperdense vessel.

Skull: Calvarium is intact. No focal lytic or blastic lesions are
present. No significant extra cranial soft tissue abnormalities are
present.

Sinuses/Orbits: The paranasal sinuses and mastoid air cells are
clear. A right lens replacement is present. Globes and orbits are
within normal limits.

ASPECTS (Alberta Stroke Program Early CT Score)

- Ganglionic level infarction (caudate, lentiform nuclei, internal
capsule, insula, M1-M3 cortex): [DATE]

- Supraganglionic infarction (M4-M6 cortex): [DATE]

Total score (0-10 with 10 being normal): [DATE]
IMPRESSION: 1. No acute intracranial abnormality or significant interval change.
2. Stable atrophy and advanced white matter disease.
3. Stable remote lacunar infarct of the right caudate head.
4. ASPECTS is [DATE]

## 2019-02-06 ENCOUNTER — Other Ambulatory Visit: Payer: Self-pay

## 2019-02-06 ENCOUNTER — Other Ambulatory Visit: Payer: Medicare HMO | Admitting: Internal Medicine

## 2019-02-06 DIAGNOSIS — Z515 Encounter for palliative care: Secondary | ICD-10-CM

## 2019-02-06 NOTE — Progress Notes (Signed)
April 29th,2020 [facility] Telephone: [phone] Fax: [phone]  Due to the current COVID-19 infection/crises, the family and facility administration  staff prefer, and have given their verbal consent for, a provider visit via telemedicine. HIPPA policies of confidentially were discussed. Video-audio (telehealth) contact was unable to be done due technical barriers from the patient's side.  PATIENT NAME:Lynn Reynolds DOB:[birth year] MVH:[id number]  PRIMARY CARE PROVIDER: Georges Lynch NP ([facility])  REFERRING PROVIDER: Georges Lynch NP ([facility]) RESPONSIBLE PARTY:Pam Lowell Guitar (DTR) [phone]  ASSESSMENT/RECOMMENDATIONS: 1.Alzheimer dementiawith behavioral disturbances; progression in functional and cognitive decline: Staff reportdecline in communicative ability, so that now speaks in a word salad. Only approximately 5-6 clear words a day. She continues with anxious behavior. She now needs assist with transferring and is wheelchair bound, whereas before she could ambulate about with her walker. She is dependent for hygiene and dressing. She now needs increased cuing to consume her meals. Her weight is stable at 97lbs, but at 5'3" her BMI is 17.2 kg/m2. Staff report that last week she had a few days when she had no solid food intake, and currently she is consuming 25% of meals. Psych NP consulted and recommended transition to hospice care in view of decline.  -Current regimen: Zoloft 150mg  qd, Xanax 0.25 qam, 0.5mg  qhs, 0.5mg  qd prn (receiveing consistentprndosing late in the morning or early afternoon). Total Xanax is 1.5mg /day. Also receiving Ativan 0.25mg  qd at 2pm.  Equilivant dosing of total short acting Benzos (Ativan 0.25mg  plus Xanax 1.5mg ) is Clonazepam 0.8mg .  Consider discontinuation of Xanax. Start Clonazepam 0.5mg  bid, with Ativan 0.25mg  prn breakthrough anxiety.  -Patient is on Keppra for ? h/o seizures.  Patient's daughter states patient was initiated on Keppra(750mg  bid)after a single episode TIA vs.seizure activity. Patient has had norecurrence of seizure.Would consider change of Keppra toDepakote, as more information/literature of successful treatment of aggressive behavior in the demented elder, with low dose Depakote sprinkles (125mg  bid to start). Alternatively, could consider initiation of Zyprexa 2.5 qd, with increase to bid if necessary.  -Discussed above recommendations with [facility] director, who will discuss with Psych ANP who will be rounding in 3 days.   2. Advanced Care directives:DNR. MOST: DNR/DNI. Limited additional medical interventions. Determine use of IVFs and antibiotics at the time of need  3. F/u NP visit in 1-2 months.  HPI / INTERVAL HISTORY:Lynn Reynolds a 85 y.o.femalewithhistory significant forAlzheimer's dementia, gait disturbance, fall with fractured right hip(05/2017),fall with non-displaced L shoulder fracture (humeral neck; conservative management),andFTT.Shehad an ER eval 09/19/09 for hypotensive syncopal episode r/t dehydration. She was treated with IVFs and Keflex for UTI. Hospitalization 12/31-10/11/2018 for medication induced hypotension. Palliative continues to follow (last visit 11/09/2018) for assist withsymptom management andgoals of care.  I spent60 minutes providing this consultation, from 2pmto3pm. More than 50% of this time was spent coordinating communication, interviewing staff, and reconciling facility MAR with [facility] EMR..   CODE STATUS:DNR. MOST details: Limited scope of medical intervention (no intubation, avoid ICU). Determine use of IVFs at the time of need. No feeding tube.  PPS:30% HOSPICE ELIGIBILITY/DIAGNOSIS:yes/Alzheimers dementia  PAST MEDICAL HISTORY:  Past Medical History:  Diagnosis Date  . Anxiety   . CVA (cerebral vascular accident) (HCC) 11/10/2016   daughter reports "made the dementia  worse" (05/23/2017)  . Dementia (HCC)    "moderate" (05/23/2017)  . GERD (gastroesophageal reflux disease)   . Hypertension   . Seizures (HCC) ~ 03/2017   "as a result  of the stroker" (05/23/2017)    SOCIAL HX:  Social History   Tobacco Use  . Smoking status: Never Smoker  . Smokeless tobacco: Never Used  Substance Use Topics  . Alcohol use: No    ALLERGIES:  Allergies  Allergen Reactions  . Lactose Intolerance (Gi) Other (See Comments)    "Allergic," per MAR  . Latex Other (See Comments)    "Allergic," per MAR  . Paxil [Paroxetine Hcl] Other (See Comments)    "Allergic," per MAR  . Pollen Extract Other (See Comments)    [facility]     PERTINENT MEDICATIONS:  Outpatient Encounter Medications as of 02/06/2019  Medication Sig  . acetaminophen (TYLENOL) 500 MG tablet Take 500 mg by mouth every 4 (four) hours as needed for mild pain.   Marland Kitchen. ALPRAZolam (XANAX) 0.5 MG tablet Take 0.25-0.5 mg by mouth See admin instructions. Take 0.25 mg by mouth in the morning, 0.5 mg at bedtime, and 0.5 mg once a day as needed for anxiety  . carvedilol (COREG) 12.5 MG tablet Take 12.5 mg by mouth 2 (two) times daily with a meal.  . Cranberry 425 MG CAPS Take 425 mg by mouth 2 (two) times daily.  . famotidine (PEPCID) 40 MG tablet Take 40 mg by mouth at bedtime.  . hydrochlorothiazide (MICROZIDE) 12.5 MG capsule Take 12.5 mg by mouth daily.  Marland Kitchen. levETIRAcetam (KEPPRA) 100 MG/ML solution Take 750 mg by mouth 2 (two) times daily.   Marland Kitchen. LORazepam (ATIVAN) 0.5 MG tablet Take 0.25 mg by mouth See admin instructions. Take 0.25 mg by mouth once a day at 2 PM  . Melatonin 3 MG SUBL Place 3 mg under the tongue at bedtime.  . mineral oil external liquid 2 drops See admin instructions. Place 2 drops into each ear once a week  . omeprazole (PRILOSEC) 40 MG capsule Take 40 mg by mouth daily.  Marland Kitchen. senna-docusate (SENOKOT-S) 8.6-50 MG tablet Take 1 tablet by mouth See admin instructions. Take 1 tablet by mouth  every other day as needed for constipation  . sertraline (ZOLOFT) 100 MG tablet Take 150 mg by mouth daily.    No facility-administered encounter medications on file as of 02/06/2019.     PHYSICAL EXAM:   Limited exam 2/2 telephonic visit. Word salad heard.  Anselm LisMary P Serpe, NP

## 2021-04-09 DEATH — deceased
